# Patient Record
Sex: Male | Born: 1960 | Race: White | Hispanic: No | Marital: Single | Smoking: Former smoker
Health system: Southern US, Community
[De-identification: ages and names within clinical notes are randomized; demographics above are authoritative.]

## PROBLEM LIST (undated history)

## (undated) DIAGNOSIS — G4733 Obstructive sleep apnea (adult) (pediatric): Secondary | ICD-10-CM

## (undated) DIAGNOSIS — J302 Other seasonal allergic rhinitis: Secondary | ICD-10-CM

## (undated) DIAGNOSIS — G459 Transient cerebral ischemic attack, unspecified: Secondary | ICD-10-CM

## (undated) DIAGNOSIS — E785 Hyperlipidemia, unspecified: Secondary | ICD-10-CM

## (undated) DIAGNOSIS — E119 Type 2 diabetes mellitus without complications: Secondary | ICD-10-CM

## (undated) DIAGNOSIS — R251 Tremor, unspecified: Secondary | ICD-10-CM

## (undated) DIAGNOSIS — E669 Obesity, unspecified: Secondary | ICD-10-CM

## (undated) DIAGNOSIS — I10 Essential (primary) hypertension: Secondary | ICD-10-CM

## (undated) HISTORY — DX: Essential (primary) hypertension: I10

## (undated) HISTORY — DX: Type 2 diabetes mellitus without complications: E11.9

## (undated) HISTORY — DX: Hyperlipidemia, unspecified: E78.5

## (undated) HISTORY — DX: Obesity, unspecified: E66.9

## (undated) HISTORY — DX: Transient cerebral ischemic attack, unspecified: G45.9

## (undated) HISTORY — DX: Obstructive sleep apnea (adult) (pediatric): G47.33

## (undated) HISTORY — DX: Tremor, unspecified: R25.1

## (undated) HISTORY — PX: HEMORROIDECTOMY: SUR656

## (undated) HISTORY — DX: Other seasonal allergic rhinitis: J30.2

## (undated) HISTORY — PX: NOSE SURGERY: SHX723

## (undated) HISTORY — PX: CARDIAC CATHETERIZATION: SHX172

---

## 1999-09-06 ENCOUNTER — Ambulatory Visit: Admission: RE | Admit: 1999-09-06 | Discharge: 1999-09-06 | Payer: Self-pay | Admitting: *Deleted

## 2000-02-07 ENCOUNTER — Ambulatory Visit (HOSPITAL_COMMUNITY): Admission: RE | Admit: 2000-02-07 | Discharge: 2000-02-08 | Payer: Self-pay | Admitting: *Deleted

## 2016-04-01 ENCOUNTER — Ambulatory Visit (INDEPENDENT_AMBULATORY_CARE_PROVIDER_SITE_OTHER): Payer: BC Managed Care – PPO | Admitting: Pulmonary Disease

## 2016-04-01 ENCOUNTER — Encounter: Payer: Self-pay | Admitting: Pulmonary Disease

## 2016-04-01 ENCOUNTER — Telehealth: Payer: Self-pay

## 2016-04-01 DIAGNOSIS — G4733 Obstructive sleep apnea (adult) (pediatric): Secondary | ICD-10-CM | POA: Insufficient documentation

## 2016-04-01 HISTORY — DX: Obstructive sleep apnea (adult) (pediatric): G47.33

## 2016-04-01 NOTE — Assessment & Plan Note (Signed)
Patient had a sleep study in 2001, day before 9/11. Had snoring, gasping, choking, witnessed apneas, hypersomnia. Unsure severity. He had nasal surgery to treat OSA but he remained symptomatic. He received a cpap machine in 2003 thereabouts. He felt better. He had issues with payment for machine and supplies so he was switched to a different company in 2016.  His primary care provider ordered a new CPAP machine through [employer]. Unsure what the settings are. He gets supplies every 3 months or so.  Lately, patient has been falling asleep before he can put on the CPAP machine. He also does a lot of reading in bed and he sometimes falls asleep before he is able to put the CPAP on. Because of this, he ends up having sleepiness and hypersomnia during the daytime. If he ends up using the CPAP, he is not as sleepy during the day and he feels well.  No download has been done from his machine. Patient states that DME is called [facility] and they are based in [place].   Plan :  We extensively discussed the importance of treating OSA and the need to use PAP therapy.   Continue with current cpap machine. We tried getting a DL off the machine but could not. Machine does not have an SD card. We instructed the patient to bring the machine to his DME company so they can do a download. Once we have a download, we can determine if he'll need his settings adjusted. The DME company is based  in [place]. We need to determine if there's an office nearer  to [place], [place] hometown. We will try to call [employer].  Pt wants to avoid sleep study 2/2 cost. If need be, he may need a 2 week diagnostic autocpap machine.    Patient was instructed to have mask, tubings, filter, reservoir cleaned at least once a week with soapy water.  Patient was instructed to call the office if he/she is having issues with the PAP device.    I advised patient to obtain sufficient amount of sleep --  7 to 8 hours at least in a 24 hr period.   Patient was advised to follow good sleep hygiene.  Patient was advised NOT to engage in activities requiring concentration and/or vigilance if he/she is and  sleepy.  Patient is NOT to drive if he/she is sleepy.

## 2016-04-01 NOTE — Telephone Encounter (Signed)
LMOM TCB need to discuss with pt. About his cpap machine, and supplies.

## 2016-04-01 NOTE — Progress Notes (Signed)
Subjective:    Patient ID: Frederick Gomez, male    DOB: [birth year], 55 y.o.   MRN: [id number]  HPI   This is the case of Frederick Gomez, 56 y.o. Male, who was referred by Dr. Gwendlyn Deutscher in consultation regarding OSA.   As you very well know, patient is a non smoker, not been diagnosed with asthma or copd.  Patient had a sleep study in 2001, day before 9/11. Had snoring, gasping, choking, witnessed apneas, hypersomnia. Unsure severity. He had nasal surgery to treat OSA but he remained symptomatic. He received a cpap machine in 2003 thereabouts. He felt better. He had issues with payment for machine and supplies so he was switched to a different company in 2016.  His primary care provider ordered a new CPAP machine through [employer]. Unsure what the settings are. He gets supplies every 3 months or so.  Lately, patient has been falling asleep before he can put on the CPAP machine. He also does a lot of reading in bed and he sometimes falls asleep before he is able to put the CPAP on. Because of this, he ends up having sleepiness and hypersomnia during the daytime. If he ends up using the CPAP, he is not as sleepy during the day and he feels well.  No download has been done from his machine. Patient states that DME is called [employer] and they are based in [place].     Review of Systems  Constitutional: Negative.  Negative for fever and unexpected weight change.  HENT: Positive for sneezing. Negative for congestion, dental problem, ear pain, nosebleeds, postnasal drip, rhinorrhea, sinus pressure, sore throat and trouble swallowing.   Eyes: Negative.  Negative for redness and itching.  Respiratory: Negative.  Negative for cough, chest tightness, shortness of breath and wheezing.   Cardiovascular: Negative.  Negative for palpitations and leg swelling.  Gastrointestinal: Negative.  Negative for nausea and vomiting.  Endocrine: Negative.   Genitourinary: Negative.  Negative for dysuria.    Musculoskeletal: Negative.  Negative for joint swelling.  Skin: Negative.  Negative for rash.  Allergic/Immunologic: Positive for environmental allergies.  Neurological: Positive for dizziness. Negative for headaches.  Hematological: Negative.  Does not bruise/bleed easily.  Psychiatric/Behavioral: Negative.  Negative for dysphoric mood. The patient is not nervous/anxious.    Past Medical History:  Diagnosis Date  . Hyperlipidemia   . Hypertension   . Seasonal allergies    (-) CA, DVT  No family history on file.  Mother had weight issues.  Father committed suicide.   No past surgical history on file.  Nasal surgery in 2002.  S/P Hemorrhoids surgery.   Social History   Social History  . Marital status: Single    Spouse name: N/A  . Number of children: N/A  . Years of education: N/A   Occupational History  . Not on file.   Social History Main Topics  . Smoking status: Former Smoker    Types: Cigarettes    Quit date: 06/10/1979  . Smokeless tobacco: Never Used  . Alcohol use Not on file  . Drug use: Unknown  . Sexual activity: Not on file   Other Topics Concern  . Not on file   Social History Narrative  . 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 (-) kids. Lives by himself.  Lives in [place].   No Known Allergies   No outpatient prescriptions prior to visit.   No facility-administered medications prior to visit.  Meds ordered this encounter  Medications  . amLODipine (NORVASC) 5 MG tablet  . clopidogrel (PLAVIX) 75 MG tablet  . losartan (COZAAR) 50 MG tablet  . primidone (MYSOLINE) 50 MG tablet    Sig: One at breakfast and one at lunch.        Objective:   Physical Exam  Vitals:  Vitals:   04/01/16 1153  BP: 130/80  Pulse: 73  SpO2: 95%  Weight: 240 lb (108.9 kg)  Height: 5' 8.5" (1.74 m)    Constitutional/General:  Pleasant, well-nourished, well-developed, not in any distress,  Comfortably seating.  Well  kempt  Body mass index is 35.96 kg/m. Wt Readings from Last 3 Encounters:  04/01/16 240 lb (108.9 kg)    HEENT: Pupils equal and reactive to light and accommodation. Anicteric sclerae. Normal nasal mucosa.   No oral  lesions,  mouth clear,  oropharynx clear, no postnasal drip. (-) Oral thrush. No dental caries.  Airway - Mallampati class III-IV  Neck: No masses. Midline trachea. No JVD, (-) LAD. (-) bruits appreciated.  Respiratory/Chest: Grossly normal chest. (-) deformity. (-) Accessory muscle use.  Symmetric expansion. (-) Tenderness on palpation.  Resonant on percussion.  Diminished BS on both lower lung zones. (-) wheezing, crackles, rhonchi (-) egophony  Cardiovascular: Regular rate and  rhythm, heart sounds normal, no murmur or gallops, no peripheral edema  Gastrointestinal:  Normal bowel sounds. Soft, non-tender. No hepatosplenomegaly.  (-) masses.   Musculoskeletal:  Normal muscle tone. Normal gait.   Extremities: Grossly normal. (-) clubbing, cyanosis.  (-) edema  Skin: (-) rash,lesions seen.   Neurological/Psychiatric : alert, oriented to time, place, person. Normal mood and affect          Assessment & Plan:  OSA (obstructive sleep apnea) Patient had a sleep study in 2001, day before 9/11. Had snoring, gasping, choking, witnessed apneas, hypersomnia. Unsure severity. He had nasal surgery to treat OSA but he remained symptomatic. He received a cpap machine in 2003 thereabouts. He felt better. He had issues with payment for machine and supplies so he was switched to a different company in 2016.  His primary care provider ordered a new CPAP machine through [facility]. Unsure what the settings are. He gets supplies every 3 months or so.  Lately, patient has been falling asleep before he can put on the CPAP machine. He also does a lot of reading in bed and he sometimes falls asleep before he is able to put the CPAP on. Because of this, he ends up having sleepiness  and hypersomnia during the daytime. If he ends up using the CPAP, he is not as sleepy during the day and he feels well.  No download has been done from his machine. Patient states that DME is called [facility] and they are based in [place].   Plan :  We extensively discussed the importance of treating OSA and the need to use PAP therapy.   Continue with current cpap machine. We tried getting a DL off the machine but could not. Machine does not have an SD card. We instructed the patient to bring the machine to his DME company so they can do a download. Once we have a download, we can determine if he'll need his settings adjusted. The DME company is based  in [place]. We need to determine if there's an office nearer  to [place], his hometown. We will try to call [facility].  Pt wants to avoid sleep study 2/2 cost. If need be, he may  need a 2 week diagnostic autocpap machine.    Patient was instructed to have mask, tubings, filter, reservoir cleaned at least once a week with soapy water.  Patient was instructed to call the office if he/she is having issues with the PAP device.    I advised patient to obtain sufficient amount of sleep --  7 to 8 hours at least in a 24 hr period.  Patient was advised to follow good sleep hygiene.  Patient was advised NOT to engage in activities requiring concentration and/or vigilance if he/she is and  sleepy.  Patient is NOT to drive if he/she is sleepy.       Thank you very much for letting me participate in this patient's care. Please do not hesitate to give me a call if you have any questions or concerns regarding the treatment plan.   Patient will follow up with me in 3 mos.     Pollie MeyerJ. Angelo A. de Dios, MD 04/01/2016   12:40 PM Pulmonary and Critical Care Medicine [facility] Pager: [phone] Office: [phone], Fax: [phone]

## 2016-04-01 NOTE — Patient Instructions (Signed)
  It was a pleasure taking care of you today!  Continue using your CPAP machine.  Please bring your machine to [facility]. We will need to get a download of her machine. We will also order supplies.  Please make sure you use your CPAP device everytime you sleep.  We will monitor the usage of your machine per your insurance requirement.  Your insurance company may take the machine from you if you are not using it regularly.   Please clean the mask, tubings, filter, water reservoir with soapy water every week.  Please use distilled water for the water reservoir.   Please call the office or your machine provider (DME company) if you are having issues with the device.   Return to clinic in 3 months

## 2016-04-02 ENCOUNTER — Telehealth: Payer: Self-pay | Admitting: Pulmonary Disease

## 2016-04-02 NOTE — Telephone Encounter (Signed)
Spoke with [name] re: yesterday's call to pt- [name] states that pt needs an SD card for his cpap machine, and that the closest office is [facility] in [place].  Per [name], [facility] can either mail him an SD card or he can pick it up in their office.  lmtcb X1 for patient to relay info.

## 2016-04-03 ENCOUNTER — Telehealth: Payer: Self-pay

## 2016-04-03 NOTE — Telephone Encounter (Signed)
LMOM telling pt. That Dr. Christene Slatese Dios wants him to have a follow up appt. In 3 months. He needs to get that scheduled. There is also another phone message for him.

## 2016-04-04 NOTE — Telephone Encounter (Signed)
Called pt back and left him the number to [facility] in [place] so he can call to get them to mail him an SD card for his cpap

## 2019-02-23 ENCOUNTER — Encounter: Payer: Self-pay | Admitting: *Deleted

## 2019-02-24 ENCOUNTER — Ambulatory Visit (INDEPENDENT_AMBULATORY_CARE_PROVIDER_SITE_OTHER): Payer: BC Managed Care – PPO | Admitting: Cardiology

## 2019-02-24 ENCOUNTER — Encounter: Payer: Self-pay | Admitting: Cardiology

## 2019-02-24 ENCOUNTER — Other Ambulatory Visit: Payer: Self-pay

## 2019-02-24 VITALS — BP 136/94 | HR 70 | Ht 68.5 in | Wt 242.0 lb

## 2019-02-24 DIAGNOSIS — I10 Essential (primary) hypertension: Secondary | ICD-10-CM

## 2019-02-24 DIAGNOSIS — E669 Obesity, unspecified: Secondary | ICD-10-CM

## 2019-02-24 DIAGNOSIS — E782 Mixed hyperlipidemia: Secondary | ICD-10-CM | POA: Diagnosis not present

## 2019-02-24 DIAGNOSIS — G459 Transient cerebral ischemic attack, unspecified: Secondary | ICD-10-CM

## 2019-02-24 DIAGNOSIS — G4733 Obstructive sleep apnea (adult) (pediatric): Secondary | ICD-10-CM | POA: Diagnosis not present

## 2019-02-24 DIAGNOSIS — E119 Type 2 diabetes mellitus without complications: Secondary | ICD-10-CM

## 2019-02-24 MED ORDER — NEBIVOLOL HCL 5 MG PO TABS: 5.0000 mg | ORAL_TABLET | Freq: Every day | ORAL | 1 refills | Status: DC

## 2019-02-24 NOTE — Patient Instructions (Signed)
Medication Instructions:  Your physician has recommended you make the following change in your medication:   Bystolic 5 mg Take 1 tab daily. This was called in to your pharmacy  If you need a refill on your cardiac medications before your next appointment, please call your pharmacy.   Lab work: Your physician recommends that you return for lab work in: 1 month Fasting lipid  If you have labs (blood work) drawn today and your tests are completely normal, you will receive your results only by: Marland Kitchen MyChart Message (if you have MyChart) OR . A paper copy in the mail If you have any lab test that is abnormal or we need to change your treatment, we will call you to review the results.  Testing/Procedures: Your physician has requested that you have an echocardiogram. Echocardiography is a painless test that uses sound waves to create images of your heart. It provides your doctor with information about the size and shape of your heart and how well your heart's chambers and valves are working. This procedure takes approximately one hour. There are no restrictions for this procedure.    Follow-Up: At [facility], you and your health needs are our priority.  As part of our continuing mission to provide you with exceptional heart care, we have created designated Provider Care Teams.  These Care Teams include your primary Cardiologist (physician) and Advanced Practice Providers (APPs -  Physician Assistants and Nurse Practitioners) who all work together to provide you with the care you need, when you need it. You will need a follow up appointment in 1 months with Dr Harriet Masson. Any Other Special Instructions Will Be Listed Below (If Applicable).

## 2019-02-24 NOTE — Progress Notes (Addendum)
Cardiology Office Note:    Date:  02/24/2019   ID:  Frederick SneddonRussell Balistreri, DOB [birth year], MRN [id number]  PCP:  Ninfa MeekerMitchell, Meredith T, FNP  Cardiologist:  No primary care provider on file.  Electrophysiologist:  None   Referring MD: Vertell NovakMitchell, Meredith T, F*   The patient was referred for hypertension. History of Present Illness:    Frederick Gomez is a 58 y.o. male with a hx of hypertension, diabetes, hyperlipidemia, OSA and previous TIAs on Plavix presents to establish cardiovascular care.  He does not have any specific complaints, he denies chest pain, palpitation or shortness of breath.   Past Medical History:  Diagnosis Date  . Diabetes (HCC)   . Hyperlipidemia   . Hypertension   . Obese   . OSA on CPAP   . Seasonal allergies   . TIA (transient ischemic attack)     Past Surgical History:  Procedure Laterality Date  . CARDIAC CATHETERIZATION    . HEMORROIDECTOMY    . NOSE SURGERY      Current Medications: Current Meds  Medication Sig  . amLODipine (NORVASC) 5 MG tablet Take 5 mg by mouth daily.   . clopidogrel (PLAVIX) 75 MG tablet Take 75 mg by mouth daily.   . empagliflozin (JARDIANCE) 10 MG TABS tablet Take 10 mg by mouth daily.  . furosemide (LASIX) 80 MG tablet Take 80 mg by mouth daily.  Marland Kitchen. lovastatin (MEVACOR) 20 MG tablet Take 20 mg by mouth at bedtime.   . metFORMIN (GLUCOPHAGE) 500 MG tablet Take 1,000 mg by mouth at bedtime.   . polycarbophil (FIBERCON) 625 MG tablet Take 625 mg by mouth as needed.   . primidone (MYSOLINE) 250 MG tablet Take 250 mg by mouth daily.      Allergies:   Hydrochlorothiazide, Niaspan [niacin], Pravastatin, Simvastatin, and Latex   Social History   Socioeconomic History  . Marital status: Single    Spouse name: Not on file  . Number of children: Not on file  . Years of education: Not on file  . Highest education level: Not on file  Occupational History  . Not on file  Social Needs  . Financial resource strain: Not on file  . Food  insecurity    Worry: Not on file    Inability: Not on file  . Transportation needs    Medical: Not on file    Non-medical: Not on file  Tobacco Use  . Smoking status: Former Smoker    Years: 3.00    Types: Cigarettes    Quit date: 06/10/1979    Years since quitting: 39.7  . Smokeless tobacco: Never Used  Substance and Sexual Activity  . Alcohol use: Never    Frequency: Never  . Drug use: Never  . Sexual activity: Not on file  Lifestyle  . Physical activity    Days per week: Not on file    Minutes per session: Not on file  . Stress: Not on file  Relationships  . Social Musicianconnections    Talks on phone: Not on file    Gets together: Not on file    Attends religious service: Not on file    Active member of club or organization: Not on file    Attends meetings of clubs or organizations: Not on file    Relationship status: Not on file  Other Topics Concern  . Not on file  Social History Narrative  . Not on file     Family History: The patient's family history  includes Diabetes in his mother; Heart attack in his mother.  ROS:   .  Review of Systems  Constitution: Negative for decreased appetite, fever and weight gain.  HENT: Negative for congestion, ear discharge, hoarse voice and sore throat.   Eyes: Negative for discharge, redness, vision loss in right eye and visual halos.  Cardiovascular: Negative for chest pain, dyspnea on exertion, leg swelling, orthopnea and palpitations.  Respiratory: Negative for cough, hemoptysis, shortness of breath and snoring.   Endocrine: Negative for heat intolerance and polyphagia.  Hematologic/Lymphatic: Negative for bleeding problem. Does not bruise/bleed easily.  Skin: Negative for flushing, nail changes, rash and suspicious lesions.  Musculoskeletal: Negative for arthritis, joint pain, muscle cramps, myalgias, neck pain and stiffness.  Gastrointestinal: Negative for abdominal pain, bowel incontinence, diarrhea and excessive appetite.   Genitourinary: Negative for decreased libido, genital sores and incomplete emptying.  Neurological: Negative for brief paralysis, focal weakness, headaches and loss of balance.  Psychiatric/Behavioral: Negative for altered mental status, depression and suicidal ideas.  Allergic/Immunologic: Negative for HIV exposure and persistent infections.      EKGs/Labs/Other Studies Reviewed:    The following studies were reviewed today:   EKG: The ekg ordered today demonstrates sinus rhythm, heart rate 70 bpm, nonspecific ST changes.  No previous EKG to compare.  Recent Labs: No results found for requested labs within last 8760 hours.  Recent Lipid Panel No results found for: CHOL, TRIG, HDL, CHOLHDL, VLDL, LDLCALC, LDLDIRECT  Physical Exam:    VS:  BP (!) 136/94 (BP Location: Right Arm, Patient Position: Sitting, Cuff Size: Normal)   Pulse 70   Ht 5' 8.5" (1.74 m)   Wt 242 lb (109.8 kg)   SpO2 98%   BMI 36.26 kg/m     Wt Readings from Last 3 Encounters:  02/24/19 242 lb (109.8 kg)  04/01/16 240 lb (108.9 kg)     GEN: Well nourished, well developed in no acute distress HEENT: Mucous membranes moist, good dentition NECK: No JVD; No carotid bruits LYMPHATICS: No lymphadenopathy CARDIAC: S1S2 noted, RRR, II/VI holosystolic murmurs, rubs, gallops RESPIRATORY:  Clear to auscultation without rales, wheezing or rhonchi  ABDOMEN: Soft, non-tender, significant truncal obesity, bowel sounds noted, no guarding EXTREMITIES: Bilateral trace ankle, no cyanosis, no clubbing MUSCULOSKELETAL: No deformity   SKIN: Warm and dry NEUROLOGIC:  Alert and oriented x 3, nonfocal PSYCHIATRIC:  Normal affect, good insight  ASSESSMENT:    1. Hypertension, unspecified type   2. OSA (obstructive sleep apnea)   3. Mixed hyperlipidemia   4. TIA (transient ischemic attack)   5. Obese body habitus   6. Type 2 diabetes mellitus without complication, without long-term current use of insulin (HCC)    PLAN:     His blood pressure is not well controlled in the office today.  He is currently on amlodipine 5 mg daily, losartan 50 mg daily, Bystolic 5 mg.  He reports that he ran out of the [place].  Going to send refills to his pharmacy which he is going to pick up.  Advised patient on decreasing his salt intake as well as increasing exercise and fruit and vegetables in his diet. His physical exam revealed a holosystolic murmur for which I will get a transthoracic echocardiogram to assess. He is on lovastatin, lipid panel will be performed prior to his next office visit. He does have untreated obstructive sleep apnea, have encouraged him to follow with a pulmonologist or sleep doctor in hopes that he will be able to be refitted  for CPAP. The patient is in agreement with the above plan.  He left the office in stable condition.  He will follow-up in 1 month.  Medication Adjustments/Labs and Tests Ordered: Current medicines are reviewed at length with the patient today.  Concerns regarding medicines are outlined above.  Orders Placed This Encounter  Procedures  . Lipid Profile  . EKG 12-Lead  . ECHOCARDIOGRAM COMPLETE   Meds ordered this encounter  Medications  . nebivolol (BYSTOLIC) 5 MG tablet    Sig: Take 1 tablet (5 mg total) by mouth daily.    Dispense:  90 tablet    Refill:  1    Patient Instructions  Medication Instructions:  Your physician has recommended you make the following change in your medication:   Bystolic 5 mg Take 1 tab daily. This was called in to your pharmacy  If you need a refill on your cardiac medications before your next appointment, please call your pharmacy.   Lab work: Your physician recommends that you return for lab work in: 1 month Fasting lipid  If you have labs (blood work) drawn today and your tests are completely normal, you will receive your results only by: Marland Kitchen MyChart Message (if you have MyChart) OR . A paper copy in the mail If you have any lab test  that is abnormal or we need to change your treatment, we will call you to review the results.  Testing/Procedures: Your physician has requested that you have an echocardiogram. Echocardiography is a painless test that uses sound waves to create images of your heart. It provides your doctor with information about the size and shape of your heart and how well your heart's chambers and valves are working. This procedure takes approximately one hour. There are no restrictions for this procedure.    Follow-Up: At [facility], you and your health needs are our priority.  As part of our continuing mission to provide you with exceptional heart care, we have created designated Provider Care Teams.  These Care Teams include your primary Cardiologist (physician) and Advanced Practice Providers (APPs -  Physician Assistants and Nurse Practitioners) who all work together to provide you with the care you need, when you need it. You will need a follow up appointment in 1 months with Dr Servando Salina. Any Other Special Instructions Will Be Listed Below (If Applicable).       Osvaldo Shipper, DO  02/24/2019 10:47 PM    [facility]

## 2019-03-28 NOTE — Addendum Note (Signed)
Addended by: Particia Nearing B on: 03/28/2019 09:55 AM   Modules accepted: Orders

## 2019-03-29 ENCOUNTER — Ambulatory Visit (INDEPENDENT_AMBULATORY_CARE_PROVIDER_SITE_OTHER): Payer: BC Managed Care – PPO

## 2019-03-29 ENCOUNTER — Other Ambulatory Visit: Payer: Self-pay

## 2019-03-29 DIAGNOSIS — I10 Essential (primary) hypertension: Secondary | ICD-10-CM | POA: Diagnosis not present

## 2019-03-29 NOTE — Progress Notes (Signed)
Complete echocardiogram has been performed.  Jimmy Mcarthur Ivins RDCS, RVT 

## 2019-03-30 ENCOUNTER — Ambulatory Visit (INDEPENDENT_AMBULATORY_CARE_PROVIDER_SITE_OTHER): Payer: BC Managed Care – PPO | Admitting: Cardiology

## 2019-03-30 ENCOUNTER — Encounter: Payer: Self-pay | Admitting: Cardiology

## 2019-03-30 VITALS — BP 150/100 | HR 76 | Ht 68.5 in | Wt 249.0 lb

## 2019-03-30 DIAGNOSIS — E782 Mixed hyperlipidemia: Secondary | ICD-10-CM

## 2019-03-30 DIAGNOSIS — E669 Obesity, unspecified: Secondary | ICD-10-CM

## 2019-03-30 DIAGNOSIS — I350 Nonrheumatic aortic (valve) stenosis: Secondary | ICD-10-CM

## 2019-03-30 DIAGNOSIS — G4733 Obstructive sleep apnea (adult) (pediatric): Secondary | ICD-10-CM

## 2019-03-30 DIAGNOSIS — I712 Thoracic aortic aneurysm, without rupture: Secondary | ICD-10-CM

## 2019-03-30 DIAGNOSIS — G459 Transient cerebral ischemic attack, unspecified: Secondary | ICD-10-CM

## 2019-03-30 DIAGNOSIS — I351 Nonrheumatic aortic (valve) insufficiency: Secondary | ICD-10-CM

## 2019-03-30 DIAGNOSIS — I10 Essential (primary) hypertension: Secondary | ICD-10-CM

## 2019-03-30 DIAGNOSIS — I7121 Aneurysm of the ascending aorta, without rupture: Secondary | ICD-10-CM

## 2019-03-30 DIAGNOSIS — E119 Type 2 diabetes mellitus without complications: Secondary | ICD-10-CM

## 2019-03-30 HISTORY — DX: Essential (primary) hypertension: I10

## 2019-03-30 HISTORY — DX: Type 2 diabetes mellitus without complications: E11.9

## 2019-03-30 HISTORY — DX: Mixed hyperlipidemia: E78.2

## 2019-03-30 HISTORY — DX: Nonrheumatic aortic (valve) stenosis: I35.0

## 2019-03-30 HISTORY — DX: Thoracic aortic aneurysm, without rupture: I71.2

## 2019-03-30 HISTORY — DX: Nonrheumatic aortic (valve) insufficiency: I35.1

## 2019-03-30 HISTORY — DX: Obesity, unspecified: E66.9

## 2019-03-30 HISTORY — DX: Aneurysm of the ascending aorta, without rupture: I71.21

## 2019-03-30 LAB — LIPID PANEL
Chol/HDL Ratio: 4.7 ratio (ref 0.0–5.0)
Cholesterol, Total: 175 mg/dL (ref 100–199)
HDL: 37 mg/dL — ABNORMAL LOW (ref >39)
LDL Chol Calc (NIH): 118 mg/dL — ABNORMAL HIGH (ref 0–99)
Triglycerides: 108 mg/dL (ref 0–149)
VLDL Cholesterol Cal: 20 mg/dL (ref 5–40)

## 2019-03-30 LAB — BASIC METABOLIC PANEL
BUN/Creatinine Ratio: 12 (ref 9–20)
BUN: 11 mg/dL (ref 6–24)
CO2: 29 mmol/L (ref 20–29)
Calcium: 9 mg/dL (ref 8.7–10.2)
Chloride: 100 mmol/L (ref 96–106)
Creatinine, Ser: 0.9 mg/dL (ref 0.76–1.27)
GFR calc Af Amer: 110 mL/min/{1.73_m2} (ref >59)
GFR calc non Af Amer: 95 mL/min/{1.73_m2} (ref >59)
Glucose: 142 mg/dL — ABNORMAL HIGH (ref 65–99)
Potassium: 4.2 mmol/L (ref 3.5–5.2)
Sodium: 140 mmol/L (ref 134–144)

## 2019-03-30 NOTE — Patient Instructions (Signed)
Medication Instructions:  Your physician recommends that you continue on your current medications as directed. Please refer to the Current Medication list given to you today.  *If you need a refill on your cardiac medications before your next appointment, please call your pharmacy*  Lab Work: None If you have labs (blood work) drawn today and your tests are completely normal, you will receive your results only by: [place] Kitchen MyChart Message (if you have MyChart) OR . A paper copy in the mail If you have any lab test that is abnormal or we need to change your treatment, we will call you to review the results.  Testing/Procedures: None  Follow-Up: At [facility], you and your health needs are our priority.  As part of our continuing mission to provide you with exceptional heart care, we have created designated Provider Care Teams.  These Care Teams include your primary Cardiologist (physician) and Advanced Practice Providers (APPs -  Physician Assistants and Nurse Practitioners) who all work together to provide you with the care you need, when you need it.  Your next appointment:   1 month  The format for your next appointment:   In Person  Provider:   Berniece Salines, DO  Other Instructions   Blood Pressure Record Sheet To take your blood pressure, you will need a blood pressure machine. You can buy a blood pressure machine (blood pressure monitor) at your clinic, drug store, or online. When choosing one, consider:  An automatic monitor that has an arm cuff.  A cuff that wraps snugly around your upper arm. You should be able to fit only one finger between your arm and the cuff.  A device that stores blood pressure reading results.  Do not choose a monitor that measures your blood pressure from your wrist or finger. Follow your health care provider's instructions for how to take your blood pressure. To use this form:  Get one reading in the morning (a.m.) before you take any medicines.   Get one reading in the evening (p.m.) before supper.  Take at least 2 readings with each blood pressure check. This makes sure the results are correct. Wait 1-2 minutes between measurements.  Write down the results in the spaces on this form.  Repeat this once a week, or as told by your health care provider.  Make a follow-up appointment with your health care provider to discuss the results. Blood pressure log Date: _______________________  a.m. _____________________(1st reading) _____________________(2nd reading)  p.m. _____________________(1st reading) _____________________(2nd reading) Date: _______________________  a.m. _____________________(1st reading) _____________________(2nd reading)  p.m. _____________________(1st reading) _____________________(2nd reading) Date: _______________________  a.m. _____________________(1st reading) _____________________(2nd reading)  p.m. _____________________(1st reading) _____________________(2nd reading) Date: _______________________  a.m. _____________________(1st reading) _____________________(2nd reading)  p.m. _____________________(1st reading) _____________________(2nd reading) Date: _______________________  a.m. _____________________(1st reading) _____________________(2nd reading)  p.m. _____________________(1st reading) _____________________(2nd reading) This information is not intended to replace advice given to you by your health care provider. Make sure you discuss any questions you have with your health care provider. Document Released: 02/22/2003 Document Revised: 07/24/2017 Document Reviewed: 05/26/2017 Elsevier Patient Education  2020 [employer].

## 2019-03-30 NOTE — Progress Notes (Signed)
Cardiology Office Note:    Date:  03/30/2019   ID:  Frederick Gomez, DOB [birth year], MRN [id number]  PCP:  Ninfa MeekerMitchell, Meredith T, FNP  Cardiologist:  Thomasene RippleKardie Lacara Dunsworth, DO  Electrophysiologist:  None   Referring MD: Vertell NovakMitchell, Meredith T, F*   Chief Complaint  Patient presents with   Follow-up    echo    History of Present Illness:    Frederick SneddonRussell Gomez is a 58 y.o. male with a hx of hx of hypertension, diabetes, hyperlipidemia, OSA and previous TIAs on Plavix, OSA on CPAP presents for follow-up visit.  The patient was initially seen on February 24, 2019 at which time he was hypertensive.  During our visit patient reported he ran out of his Bystolic.  And we did send that medication to the pharmacy for him.  So with that he was supposed to be on amlodipine 5 mg daily, losartan 50 mg daily, and Bystolic 5 mg daily.  In addition an echocardiogram was performed due to physical exam appreciating holosystolic murmur.  The patient is here for follow-up visit today.  He offers no complaints.  Past Medical History:  Diagnosis Date   Diabetes (HCC)    Hyperlipidemia    Hypertension    Obese    OSA on CPAP    Seasonal allergies    TIA (transient ischemic attack)     Past Surgical History:  Procedure Laterality Date   CARDIAC CATHETERIZATION     HEMORROIDECTOMY     NOSE SURGERY      Current Medications: Current Meds  Medication Sig   amLODipine (NORVASC) 5 MG tablet Take 5 mg by mouth daily.    clopidogrel (PLAVIX) 75 MG tablet Take 75 mg by mouth daily.    empagliflozin (JARDIANCE) 10 MG TABS tablet Take 10 mg by mouth daily.   furosemide (LASIX) 80 MG tablet Take 80 mg by mouth daily.   lovastatin (MEVACOR) 20 MG tablet Take 20 mg by mouth at bedtime.    metFORMIN (GLUCOPHAGE) 500 MG tablet Take 1,000 mg by mouth at bedtime.    nebivolol (BYSTOLIC) 5 MG tablet Take 1 tablet (5 mg total) by mouth daily.   polycarbophil (FIBERCON) 625 MG tablet Take 625 mg by mouth as  needed.    primidone (MYSOLINE) 250 MG tablet Take 250 mg by mouth daily.      Allergies:   Hydrochlorothiazide, Niaspan [niacin], Pravastatin, Simvastatin, and Latex   Social History   Socioeconomic History   Marital status: Single    Spouse name: Not on file   Number of children: Not on file   Years of education: Not on file   Highest education level: Not on file  Occupational History   Not on file  Social Needs   Financial resource strain: Not on file   Food insecurity    Worry: Not on file    Inability: Not on file   Transportation needs    Medical: Not on file    Non-medical: Not on file  Tobacco Use   Smoking status: Former Smoker    Years: 3.00    Types: Cigarettes    Quit date: 06/10/1979    Years since quitting: 39.8   Smokeless tobacco: Never Used  Substance and Sexual Activity   Alcohol use: Never    Frequency: Never   Drug use: Never   Sexual activity: Not on file  Lifestyle   Physical activity    Days per week: Not on file    Minutes per session: Not  on file   Stress: Not on file  Relationships   Social connections    Talks on phone: Not on file    Gets together: Not on file    Attends religious service: Not on file    Active member of club or organization: Not on file    Attends meetings of clubs or organizations: Not on file    Relationship status: Not on file  Other Topics Concern   Not on file  Social History Narrative   Not on file     Family History: The patient's family history includes Diabetes in his mother; Heart attack in his mother.  ROS:   Review of Systems  Constitution: Negative for decreased appetite, fever and weight gain.  HENT: Negative for congestion, ear discharge, hoarse voice and sore throat.   Eyes: Negative for discharge, redness, vision loss in right eye and visual halos.  Cardiovascular: Negative for chest pain, dyspnea on exertion, leg swelling, orthopnea and palpitations.  Respiratory: Negative  for cough, hemoptysis, shortness of breath and snoring.   Endocrine: Negative for heat intolerance and polyphagia.  Hematologic/Lymphatic: Negative for bleeding problem. Does not bruise/bleed easily.  Skin: Negative for flushing, nail changes, rash and suspicious lesions.  Musculoskeletal: Negative for arthritis, joint pain, muscle cramps, myalgias, neck pain and stiffness.  Gastrointestinal: Negative for abdominal pain, bowel incontinence, diarrhea and excessive appetite.  Genitourinary: Negative for decreased libido, genital sores and incomplete emptying.  Neurological: Negative for brief paralysis, focal weakness, headaches and loss of balance.  Psychiatric/Behavioral: Negative for altered mental status, depression and suicidal ideas.  Allergic/Immunologic: Negative for HIV exposure and persistent infections.    EKGs/Labs/Other Studies Reviewed:    The following studies were reviewed today:   EKG: None today Recent Labs: 03/29/2019: BUN 11; Creatinine, Ser 0.90; Potassium 4.2; Sodium 140  Recent Lipid Panel    Component Value Date/Time   CHOL 175 03/29/2019 0858   TRIG 108 03/29/2019 0858   HDL 37 (L) 03/29/2019 0858   CHOLHDL 4.7 03/29/2019 0858   LDLCALC 118 (H) 03/29/2019 0858    Physical Exam:    VS:  BP (!) 150/100 (BP Location: Left Arm, Patient Position: Sitting, Cuff Size: Normal)    Pulse 76    Ht 5' 8.5" (1.74 m)    Wt 249 lb (112.9 kg)    SpO2 98%    BMI 37.31 kg/m     Wt Readings from Last 3 Encounters:  03/30/19 249 lb (112.9 kg)  02/24/19 242 lb (109.8 kg)  04/01/16 240 lb (108.9 kg)     GEN: obese, Well nourished, well developed in no acute distress HEENT: Normal NECK: No JVD; No carotid bruits LYMPHATICS: No lymphadenopathy CARDIAC: S1S2 noted,RRR, no murmurs, rubs, gallops RESPIRATORY:  Clear to auscultation without rales, wheezing or rhonchi  ABDOMEN: Soft, non-tender, non-distended, +bowel sounds, no guarding. EXTREMITIES: No edema, No cyanosis,  no clubbing MUSCULOSKELETAL:  No edema; No deformity  SKIN: Warm and dry NEUROLOGIC:  Alert and oriented x 3, non-focal PSYCHIATRIC:  Normal affect, good insight  ASSESSMENT:    1. Ascending aortic aneurysm (HCC)   2. Essential hypertension   3. Mild aortic stenosis   4. Mild aortic regurgitation   5. TIA (transient ischemic attack)   6. Type 2 diabetes mellitus without complication, without long-term current use of insulin (HCC)   7. Mixed hyperlipidemia   8. OSA (obstructive sleep apnea)   9. Obesity (BMI 30-39.9)    PLAN:    In order of problems  listed above:1.  I did discuss his echocardiogram results with him today explained to patient that he does have a dilated ascending aorta (4.1 cm).  There is also evidence of mild aortic stenosis and mild regurgitation.  We will get a CTA of the chest in 6 months to assess for any worsening progression of this aneurysm.    2.  He is hypertensive today in the office manually 150/100 mmHg, he did not take his medication prior to his visit.  He tells me that his blood pressure has been mostly running in the 409W to 119J systolic.  I have advised the patient that he needs to take his blood pressure daily regardless of any doctor visit.  I also stressed with him that controlling his blood pressure will really help [place] in slowing down the progression of his aortic aneurysm.  I have also advised the patient to decrease his salt intake.  3.  The patient understands the need to lose weight with diet and exercise. We have discussed specific strategies for this.  4.  Continue current statin, and Plavix.  The patient is in agreement with the above plan. The patient left the office in stable condition.  The patient will follow up in 1 month.   Medication Adjustments/Labs and Tests Ordered: Current medicines are reviewed at length with the patient today.  Concerns regarding medicines are outlined above.  No orders of the defined types were placed in this  encounter.  No orders of the defined types were placed in this encounter.   Patient Instructions  Medication Instructions:  Your physician recommends that you continue on your current medications as directed. Please refer to the Current Medication list given to you today.  *If you need a refill on your cardiac medications before your next appointment, please call your pharmacy*  Lab Work: None If you have labs (blood work) drawn today and your tests are completely normal, you will receive your results only by:  [place] (if you have MyChart) OR  A paper copy in the mail If you have any lab test that is abnormal or we need to change your treatment, we will call you to review the results.  Testing/Procedures: None  Follow-Up: At [facility], you and your health needs are our priority.  As part of our continuing mission to provide you with exceptional heart care, we have created designated Provider Care Teams.  These Care Teams include your primary Cardiologist (physician) and Advanced Practice Providers (APPs -  Physician Assistants and Nurse Practitioners) who all work together to provide you with the care you need, when you need it.  Your next appointment:   1 month  The format for your next appointment:   In Person  Provider:   Berniece Salines, DO  Other Instructions   Blood Pressure Record Sheet To take your blood pressure, you will need a blood pressure machine. You can buy a blood pressure machine (blood pressure monitor) at your clinic, drug store, or online. When choosing one, consider:  An automatic monitor that has an arm cuff.  A cuff that wraps snugly around your upper arm. You should be able to fit only one finger between your arm and the cuff.  A device that stores blood pressure reading results.  Do not choose a monitor that measures your blood pressure from your wrist or finger. Follow your health care provider's instructions for how to take your  blood pressure. To use this form:  Get one reading in  the morning (a.m.) before you take any medicines.  Get one reading in the evening (p.m.) before supper.  Take at least 2 readings with each blood pressure check. This makes sure the results are correct. Wait 1-2 minutes between measurements.  Write down the results in the spaces on this form.  Repeat this once a week, or as told by your health care provider.  Make a follow-up appointment with your health care provider to discuss the results. Blood pressure log Date: _______________________  a.m. _____________________(1st reading) _____________________(2nd reading)  p.m. _____________________(1st reading) _____________________(2nd reading) Date: _______________________  a.m. _____________________(1st reading) _____________________(2nd reading)  p.m. _____________________(1st reading) _____________________(2nd reading) Date: _______________________  a.m. _____________________(1st reading) _____________________(2nd reading)  p.m. _____________________(1st reading) _____________________(2nd reading) Date: _______________________  a.m. _____________________(1st reading) _____________________(2nd reading)  p.m. _____________________(1st reading) _____________________(2nd reading) Date: _______________________  a.m. _____________________(1st reading) _____________________(2nd reading)  p.m. _____________________(1st reading) _____________________(2nd reading) This information is not intended to replace advice given to you by your health care provider. Make sure you discuss any questions you have with your health care provider. Document Released: 02/22/2003 Document Revised: 07/24/2017 Document Reviewed: 05/26/2017 Elsevier Patient Education  2020 [employer]     [employer]  Tips to measure your blood pressure correctly  To determine whether you have hypertension, a medical professional will take a blood pressure  reading. How you prepare for the test, the position of your arm, and other factors can change a blood pressure reading by 10% or more. That could be enough to hide high blood pressure, start you on a drug you don't really need, or lead your doctor to incorrectly adjust your medications. National and international guidelines offer specific instructions for measuring blood pressure. If a doctor, nurse, or medical assistant isn't doing it right, don't hesitate to ask him or her to get with the guidelines. Here's what you can do to ensure a correct reading:  Don't drink a caffeinated beverage or smoke during the 30 minutes before the test.  Sit quietly for five minutes before the test begins.  During the measurement, sit in a chair with your feet on the floor and your arm supported so your elbow is at about heart level.  The inflatable part of the cuff should completely cover at least 80% of your upper arm, and the cuff should be placed on bare skin, not over a shirt.  Don't talk during the measurement.  Have your blood pressure measured twice, with a brief break in between. If the readings are different by 5 points or more, have it done a third time. There are times to break these rules. If you sometimes feel lightheaded when getting out of bed in the morning or when you stand after sitting, you should have your blood pressure checked while seated and then while standing to see if it falls from one position to the next. Because blood pressure varies throughout the day, your doctor will rarely diagnose hypertension on the basis of a single reading. Instead, he or she will want to confirm the measurements on at least two occasions, usually within a few weeks of one another. The exception to this rule is if you have a blood pressure reading of 180/110 mm Hg or higher. A result this high usually calls for prompt treatment. It's also a good idea to have your blood pressure measured in both arms at least  once, since the reading in one arm (usually the right) may be higher than that in the left. A 2014 study in The  American Journal of Medicine of nearly 3,400 people found average arm- to-arm differences in systolic blood pressure of about 5 points. The higher number should be used to make treatment decisions. In 2017, new guidelines from the American Heart Association, the [employer] of Cardiology, and nine other health organizations lowered the diagnosis of high blood pressure to 130/80 mm Hg or higher for all adults. The guidelines also redefined the various blood pressure categories to now include normal, elevated, Stage 1 hypertension, Stage 2 hypertension, and hypertensive crisis (see "Blood pressure categories"). Blood pressure categories  Blood pressure category SYSTOLIC (upper number)  DIASTOLIC (lower number)  Normal Less than 120 mm Hg and Less than 80 mm Hg  Elevated 120-129 mm Hg and Less than 80 mm Hg  High blood pressure: Stage 1 hypertension 130-139 mm Hg or 80-89 mm Hg  High blood pressure: Stage 2 hypertension 140 mm Hg or higher or 90 mm Hg or higher  Hypertensive crisis (consult your doctor immediately) Higher than 180 mm Hg and/or Higher than 120 mm Hg  Source: American Heart Association and American Stroke Association. For more on getting your blood pressure under control, buy Controlling Your Blood Pressure, a Special Health Report from [facility]. Adopting a Healthy Lifestyle.  Know what a healthy weight is for you (roughly BMI <25) and aim to maintain this   Aim for 7+ servings of fruits and vegetables daily   65-80+ fluid ounces of water or unsweet tea for healthy kidneys   Limit to max 1 drink of alcohol per day; avoid smoking/tobacco   Limit animal fats in diet for cholesterol and heart health - choose grass fed whenever available   Avoid highly processed foods, and foods high in saturated/trans fats   Aim for low stress - take time to unwind and  care for your mental health   Aim for 150 min of moderate intensity exercise weekly for heart health, and weights twice weekly for bone health   Aim for 7-9 hours of sleep daily   When it comes to diets, agreement about the perfect plan isnt easy to find, even among the experts. Experts at the [facility] [employer] developed an idea known as the Healthy Eating Plate. Just imagine a plate divided into logical, healthy portions.   The emphasis is on diet quality:   Load up on vegetables and fruits - one-half of your plate: Aim for color and variety, and remember that potatoes dont count.   Go for whole grains - one-quarter of your plate: Whole wheat, barley, wheat berries, quinoa, oats, brown rice, and foods made with them. If you want pasta, go with whole wheat pasta.   Protein power - one-quarter of your plate: Fish, chicken, beans, and nuts are all healthy, versatile protein sources. Limit red meat.   The diet, however, does go beyond the plate, offering a few other suggestions.   Use healthy plant oils, such as olive, canola, soy, corn, sunflower and peanut. Check the labels, and avoid partially hydrogenated oil, which have unhealthy trans fats.   If youre thirsty, drink water. Coffee and tea are good in moderation, but skip sugary drinks and limit milk and dairy products to one or two daily servings.   The type of carbohydrate in the diet is more important than the amount. Some sources of carbohydrates, such as vegetables, fruits, whole grains, and beans-are healthier than others.   Finally, stay active  Signed, Thomasene Ripple, DO  03/30/2019 10:37 AM  [facility]

## 2019-05-02 ENCOUNTER — Other Ambulatory Visit: Payer: Self-pay

## 2019-05-02 ENCOUNTER — Encounter: Payer: Self-pay | Admitting: Cardiology

## 2019-05-02 ENCOUNTER — Ambulatory Visit (INDEPENDENT_AMBULATORY_CARE_PROVIDER_SITE_OTHER): Payer: BC Managed Care – PPO | Admitting: Cardiology

## 2019-05-02 VITALS — BP 160/96 | HR 84 | Ht 68.5 in | Wt 248.0 lb

## 2019-05-02 DIAGNOSIS — I712 Thoracic aortic aneurysm, without rupture: Secondary | ICD-10-CM

## 2019-05-02 DIAGNOSIS — I350 Nonrheumatic aortic (valve) stenosis: Secondary | ICD-10-CM | POA: Diagnosis not present

## 2019-05-02 DIAGNOSIS — E119 Type 2 diabetes mellitus without complications: Secondary | ICD-10-CM | POA: Diagnosis not present

## 2019-05-02 DIAGNOSIS — E782 Mixed hyperlipidemia: Secondary | ICD-10-CM

## 2019-05-02 DIAGNOSIS — E669 Obesity, unspecified: Secondary | ICD-10-CM

## 2019-05-02 DIAGNOSIS — I7121 Aneurysm of the ascending aorta, without rupture: Secondary | ICD-10-CM

## 2019-05-02 DIAGNOSIS — I10 Essential (primary) hypertension: Secondary | ICD-10-CM

## 2019-05-02 MED ORDER — NEBIVOLOL HCL 20 MG PO TABS: 20.0000 mg | ORAL_TABLET | Freq: Every day | ORAL | 5 refills | Status: DC

## 2019-05-02 NOTE — Patient Instructions (Signed)
Medication Instructions:  Your physician has recommended you make the following change in your medication: INCREASE BYSTOLIC TO 20 MG DAILY  *If you need a refill on your cardiac medications before your next appointment, please call your pharmacy*  Lab Work: NONE If you have labs (blood work) drawn today and your tests are completely normal, you will receive your results only by: Marland Kitchen MyChart Message (if you have MyChart) OR . A paper copy in the mail If you have any lab test that is abnormal or we need to change your treatment, we will call you to review the results.  Testing/Procedures: NONE  Follow-Up: At [facility], you and your health needs are our priority.  As part of our continuing mission to provide you with exceptional heart care, we have created designated Provider Care Teams.  These Care Teams include your primary Cardiologist (physician) and Advanced Practice Providers (APPs -  Physician Assistants and Nurse Practitioners) who all work together to provide you with the care you need, when you need it.  Your next appointment:   1 month(s)  The format for your next appointment:   In Person  Provider:   Berniece Salines, DO  Other Instructions

## 2019-05-02 NOTE — Progress Notes (Signed)
Cardiology Office Note:    Date:  05/02/2019   ID:  Frederick Gomez, DOB 10/12/1960, MRN [id number]  PCP:  Ninfa Meeker, FNP  Cardiologist:  Thomasene Ripple, DO  Electrophysiologist:  None   Referring MD: Vertell Novak*   Chief Complaint  Patient presents with  . Follow-up    History of Present Illness:    Frederick Gomez is a 58 y.o. male with a hx of hx of hypertension, diabetes, hyperlipidemia, OSA and previous TIAs on Plavix, OSA on CPAP presents for follow-up visit.  The patient was initially seen on February 24, 2019 at which time he was hypertensive.  During our visit patient reported he ran out of his Bystolic.  And we did send that medication to the pharmacy for him.  So with that he was supposed to be on amlodipine 5 mg daily, losartan 50 mg daily, and Bystolic 5 mg daily.  He did come for follow-up visit on March 30, 2019 at that time his blood pressure was again elevated but the patient preferred not to start any new medication as he did not take his blood pressure meds prior to his visit.  In addition he is adamant that his blood pressure was running 120s to 130 systolic at home.  He is here for follow-up today.    Past Medical History:  Diagnosis Date  . Diabetes (HCC)   . Hyperlipidemia   . Hypertension   . Obese   . OSA on CPAP   . Seasonal allergies   . TIA (transient ischemic attack)     Past Surgical History:  Procedure Laterality Date  . CARDIAC CATHETERIZATION    . HEMORROIDECTOMY    . NOSE SURGERY      Current Medications: Current Meds  Medication Sig  . amLODipine (NORVASC) 5 MG tablet Take 5 mg by mouth daily.   . clopidogrel (PLAVIX) 75 MG tablet Take 75 mg by mouth daily.   . empagliflozin (JARDIANCE) 10 MG TABS tablet Take 10 mg by mouth daily.  . furosemide (LASIX) 80 MG tablet Take 80 mg by mouth daily.  Marland Kitchen lovastatin (MEVACOR) 20 MG tablet Take 20 mg by mouth at bedtime.   . metFORMIN (GLUCOPHAGE) 500 MG tablet Take 1,000 mg  by mouth at bedtime.   . nebivolol (BYSTOLIC) 5 MG tablet Take 1 tablet (5 mg total) by mouth daily.  . polycarbophil (FIBERCON) 625 MG tablet Take 625 mg by mouth as needed.   . primidone (MYSOLINE) 250 MG tablet Take 250 mg by mouth daily.      Allergies:   Hydrochlorothiazide, Niaspan [niacin], Pravastatin, Simvastatin, and Latex   Social History   Socioeconomic History  . Marital status: Single    Spouse name: Not on file  . Number of children: Not on file  . Years of education: Not on file  . Highest education level: Not on file  Occupational History  . Not on file  Social Needs  . Financial resource strain: Not on file  . Food insecurity    Worry: Not on file    Inability: Not on file  . Transportation needs    Medical: Not on file    Non-medical: Not on file  Tobacco Use  . Smoking status: Former Smoker    Years: 3.00    Types: Cigarettes    Quit date: 06/10/1979    Years since quitting: 39.9  . Smokeless tobacco: Never Used  Substance and Sexual Activity  . Alcohol use: Never  Frequency: Never  . Drug use: Never  . Sexual activity: Not on file  Lifestyle  . Physical activity    Days per week: Not on file    Minutes per session: Not on file  . Stress: Not on file  Relationships  . Social Herbalist on phone: Not on file    Gets together: Not on file    Attends religious service: Not on file    Active member of club or organization: Not on file    Attends meetings of clubs or organizations: Not on file    Relationship status: Not on file  Other Topics Concern  . Not on file  Social History Narrative  . Not on file     Family History: The patient's family history includes Diabetes in his mother; Heart attack in his mother.  ROS:   Review of Systems  Constitution: Negative for decreased appetite, fever and weight gain.  HENT: Negative for congestion, ear discharge, hoarse voice and sore throat.   Eyes: Negative for discharge, redness,  vision loss in right eye and visual halos.  Cardiovascular: Negative for chest pain, dyspnea on exertion, leg swelling, orthopnea and palpitations.  Respiratory: Negative for cough, hemoptysis, shortness of breath and snoring.   Endocrine: Negative for heat intolerance and polyphagia.  Hematologic/Lymphatic: Negative for bleeding problem. Does not bruise/bleed easily.  Skin: Negative for flushing, nail changes, rash and suspicious lesions.  Musculoskeletal: Negative for arthritis, joint pain, muscle cramps, myalgias, neck pain and stiffness.  Gastrointestinal: Negative for abdominal pain, bowel incontinence, diarrhea and excessive appetite.  Genitourinary: Negative for decreased libido, genital sores and incomplete emptying.  Neurological: Negative for brief paralysis, focal weakness, headaches and loss of balance.  Psychiatric/Behavioral: Negative for altered mental status, depression and suicidal ideas.  Allergic/Immunologic: Negative for HIV exposure and persistent infections.    EKGs/Labs/Other Studies Reviewed:    The following studies were reviewed today:   EKG: None today  Recent Labs: 03/29/2019: BUN 11; Creatinine, Ser 0.90; Potassium 4.2; Sodium 140  Recent Lipid Panel    Component Value Date/Time   CHOL 175 03/29/2019 0858   TRIG 108 03/29/2019 0858   HDL 37 (L) 03/29/2019 0858   CHOLHDL 4.7 03/29/2019 0858   LDLCALC 118 (H) 03/29/2019 0858    Physical Exam:    VS:  BP (!) 160/96 (BP Location: Left Arm, Patient Position: Sitting, Cuff Size: Normal)   Pulse 84   Ht 5' 8.5" (1.74 m)   Wt 248 lb (112.5 kg)   SpO2 96%   BMI 37.16 kg/m     Wt Readings from Last 3 Encounters:  05/02/19 248 lb (112.5 kg)  03/30/19 249 lb (112.9 kg)  02/24/19 242 lb (109.8 kg)     GEN: Well nourished, well developed in no acute distress HEENT: Normal NECK: No JVD; No carotid bruits LYMPHATICS: No lymphadenopathy CARDIAC: S1S2 noted,RRR, no murmurs, rubs, gallops RESPIRATORY:   Clear to auscultation without rales, wheezing or rhonchi  ABDOMEN: Soft, non-tender, non-distended, +bowel sounds, no guarding. EXTREMITIES: No edema, No cyanosis, no clubbing MUSCULOSKELETAL:  No edema; No deformity  SKIN: Warm and dry NEUROLOGIC:  Alert and oriented x 3, non-focal PSYCHIATRIC:  Normal affect, good insight  ASSESSMENT:    1. Ascending aortic aneurysm ([place])   2. Essential hypertension   3. Mild aortic stenosis   4. TIA (transient ischemic attack)   5. Type 2 diabetes mellitus without complication, without long-term current use of insulin ([place])   6. Mixed  hyperlipidemia   7. Obesity (BMI 30-39.9)    PLAN:    1.  His blood pressure is elevated today in the office.  Therefore at this time going to increase by Bystolic to 20 mg daily.  Therefore he will not be taking amlodipine 5 mg daily, Lasix 80 mg daily, and Bystolic 20 mg daily.  2.  Ascending aortic aneurysm-we will continue aggressive blood pressure control.  Repeat imaging in 6 to 12 months.  3.  Hyperlipidemia, continue lovastatin.  The patient is in agreement with the above plan. The patient left the office in stable condition.  The patient will follow up in 1 month for blood pressure follow-up   Medication Adjustments/Labs and Tests Ordered: Current medicines are reviewed at length with the patient today.  Concerns regarding medicines are outlined above.  No orders of the defined types were placed in this encounter.  No orders of the defined types were placed in this encounter.   There are no Patient Instructions on file for this visit.   Adopting a Healthy Lifestyle.  Know what a healthy weight is for you (roughly BMI <25) and aim to maintain this   Aim for 7+ servings of fruits and vegetables daily   65-80+ fluid ounces of water or unsweet tea for healthy kidneys   Limit to max 1 drink of alcohol per day; avoid smoking/tobacco   Limit animal fats in diet for cholesterol and heart health -  choose grass fed whenever available   Avoid highly processed foods, and foods high in saturated/trans fats   Aim for low stress - take time to unwind and care for your mental health   Aim for 150 min of moderate intensity exercise weekly for heart health, and weights twice weekly for bone health   Aim for 7-9 hours of sleep daily   When it comes to diets, agreement about the perfect plan isnt easy to find, even among the experts. Experts at the [facility] [employer] developed an idea known as the Healthy Eating Plate. Just imagine a plate divided into logical, healthy portions.   The emphasis is on diet quality:   Load up on vegetables and fruits - one-half of your plate: Aim for color and variety, and remember that potatoes dont count.   Go for whole grains - one-quarter of your plate: Whole wheat, barley, wheat berries, quinoa, oats, brown rice, and foods made with them. If you want pasta, go with whole wheat pasta.   Protein power - one-quarter of your plate: Fish, chicken, beans, and nuts are all healthy, versatile protein sources. Limit red meat.   The diet, however, does go beyond the plate, offering a few other suggestions.   Use healthy plant oils, such as olive, canola, soy, corn, sunflower and peanut. Check the labels, and avoid partially hydrogenated oil, which have unhealthy trans fats.   If youre thirsty, drink water. Coffee and tea are good in moderation, but skip sugary drinks and limit milk and dairy products to one or two daily servings.   The type of carbohydrate in the diet is more important than the amount. Some sources of carbohydrates, such as vegetables, fruits, whole grains, and beans-are healthier than others.   Finally, stay active  Signed, Thomasene RippleKardie Mykelti Goldenstein, DO  05/02/2019 1:04 PM    [facility]

## 2019-06-06 ENCOUNTER — Telehealth: Payer: Self-pay | Admitting: Cardiology

## 2019-06-06 NOTE — Telephone Encounter (Signed)
Wants to  Know does he take his Bistolinc at morning or night

## 2019-06-06 NOTE — Telephone Encounter (Signed)
Telephone call to patient. Left message that it does not matter when he takes the Bystolic as long as he takes it at the same time. Patient to call with any more questions.

## 2019-06-07 ENCOUNTER — Ambulatory Visit: Payer: BC Managed Care – PPO | Admitting: Cardiology

## 2019-07-07 DIAGNOSIS — E1165 Type 2 diabetes mellitus with hyperglycemia: Secondary | ICD-10-CM

## 2019-07-07 DIAGNOSIS — U071 COVID-19: Secondary | ICD-10-CM

## 2019-07-07 DIAGNOSIS — E871 Hypo-osmolality and hyponatremia: Secondary | ICD-10-CM

## 2019-07-07 DIAGNOSIS — I352 Nonrheumatic aortic (valve) stenosis with insufficiency: Secondary | ICD-10-CM | POA: Diagnosis not present

## 2019-07-07 DIAGNOSIS — E876 Hypokalemia: Secondary | ICD-10-CM

## 2019-07-08 ENCOUNTER — Other Ambulatory Visit: Payer: Self-pay | Admitting: Physician Assistant

## 2019-07-08 DIAGNOSIS — E119 Type 2 diabetes mellitus without complications: Secondary | ICD-10-CM

## 2019-07-08 DIAGNOSIS — I10 Essential (primary) hypertension: Secondary | ICD-10-CM

## 2019-07-08 DIAGNOSIS — E876 Hypokalemia: Secondary | ICD-10-CM | POA: Diagnosis not present

## 2019-07-08 DIAGNOSIS — U071 COVID-19: Secondary | ICD-10-CM

## 2019-07-08 DIAGNOSIS — E871 Hypo-osmolality and hyponatremia: Secondary | ICD-10-CM | POA: Diagnosis not present

## 2019-07-08 DIAGNOSIS — E1165 Type 2 diabetes mellitus with hyperglycemia: Secondary | ICD-10-CM | POA: Diagnosis not present

## 2019-07-08 DIAGNOSIS — G459 Transient cerebral ischemic attack, unspecified: Secondary | ICD-10-CM

## 2019-07-08 DIAGNOSIS — I350 Nonrheumatic aortic (valve) stenosis: Secondary | ICD-10-CM

## 2019-07-08 NOTE — Progress Notes (Signed)
  I connected by phone with Frederick Gomez on 07/08/2019 at 2:00 PM to discuss the potential use of an new treatment for mild to moderate COVID-19 viral infection in non-hospitalized patients.  This patient is a 59 y.o. male that meets the FDA criteria for Emergency Use Authorization of bamlanivimab or casirivimab\imdevimab.  Has a (+) direct SARS-CoV-2 viral test result  Has mild or moderate COVID-19   Is ? 57 years of age and weighs ? 40 kg  Is NOT hospitalized due to COVID-19  Is NOT requiring oxygen therapy or requiring an increase in baseline oxygen flow rate due to COVID-19  Is within 10 days of symptom onset  Has at least one of the high risk factor(s) for progression to severe COVID-19 and/or hospitalization as defined in EUA.  Specific high risk criteria : Diabetes   HX of HTN   I have spoken and communicated the following to the patient or parent/caregiver:  1. FDA has authorized the emergency use of bamlanivimab and casirivimab\imdevimab for the treatment of mild to moderate COVID-19 in adults and pediatric patients with positive results of direct SARS-CoV-2 viral testing who are 72 years of age and older weighing at least 40 kg, and who are at high risk for progressing to severe COVID-19 and/or hospitalization.  2. The significant known and potential risks and benefits of bamlanivimab and casirivimab\imdevimab, and the extent to which such potential risks and benefits are unknown.  3. Information on available alternative treatments and the risks and benefits of those alternatives, including clinical trials.  4. Patients treated with bamlanivimab and casirivimab\imdevimab should continue to self-isolate and use infection control measures (e.g., wear mask, isolate, social distance, avoid sharing personal items, clean and disinfect "high touch" surfaces, and frequent handwashing) according to CDC guidelines.   5. The patient or parent/caregiver has the option to accept or refuse  bamlanivimab or casirivimab\imdevimab .  After reviewing this information with the patient, The patient agreed to proceed with receiving the bamlanimivab infusion and will be provided a copy of the Fact sheet prior to receiving the infusion.[name] [name] 07/08/2019 2:00 PM

## 2019-07-11 ENCOUNTER — Ambulatory Visit (HOSPITAL_COMMUNITY): Payer: BC Managed Care – PPO

## 2019-07-12 ENCOUNTER — Encounter (HOSPITAL_COMMUNITY): Payer: Self-pay | Admitting: Internal Medicine

## 2019-07-12 ENCOUNTER — Ambulatory Visit (HOSPITAL_COMMUNITY): Payer: BC Managed Care – PPO

## 2019-07-12 ENCOUNTER — Inpatient Hospital Stay (HOSPITAL_COMMUNITY): Payer: BC Managed Care – PPO

## 2019-07-12 ENCOUNTER — Inpatient Hospital Stay (HOSPITAL_COMMUNITY)
Admission: AD | Admit: 2019-07-12 | Discharge: 2019-07-19 | DRG: 177 | Disposition: A | Discharge status: Home Health | Payer: BC Managed Care – PPO | Source: Ambulatory Visit | Attending: Internal Medicine | Admitting: Internal Medicine

## 2019-07-12 ENCOUNTER — Ambulatory Visit (HOSPITAL_COMMUNITY)
Admission: RE | Admit: 2019-07-12 | Discharge: 2019-07-12 | Disposition: A | Discharge status: Home / Self Care | Payer: BC Managed Care – PPO | Source: Ambulatory Visit | Attending: Pulmonary Disease | Admitting: Pulmonary Disease

## 2019-07-12 ENCOUNTER — Other Ambulatory Visit: Payer: Self-pay

## 2019-07-12 DIAGNOSIS — Z833 Family history of diabetes mellitus: Secondary | ICD-10-CM | POA: Diagnosis not present

## 2019-07-12 DIAGNOSIS — Z9119 Patient's noncompliance with other medical treatment and regimen: Secondary | ICD-10-CM

## 2019-07-12 DIAGNOSIS — I119 Hypertensive heart disease without heart failure: Secondary | ICD-10-CM | POA: Diagnosis present

## 2019-07-12 DIAGNOSIS — J1282 Pneumonia due to coronavirus disease 2019: Secondary | ICD-10-CM | POA: Diagnosis present

## 2019-07-12 DIAGNOSIS — Z23 Encounter for immunization: Secondary | ICD-10-CM

## 2019-07-12 DIAGNOSIS — Z888 Allergy status to other drugs, medicaments and biological substances status: Secondary | ICD-10-CM | POA: Diagnosis not present

## 2019-07-12 DIAGNOSIS — R0600 Dyspnea, unspecified: Secondary | ICD-10-CM

## 2019-07-12 DIAGNOSIS — Z7984 Long term (current) use of oral hypoglycemic drugs: Secondary | ICD-10-CM

## 2019-07-12 DIAGNOSIS — Z8249 Family history of ischemic heart disease and other diseases of the circulatory system: Secondary | ICD-10-CM | POA: Diagnosis not present

## 2019-07-12 DIAGNOSIS — G4733 Obstructive sleep apnea (adult) (pediatric): Secondary | ICD-10-CM | POA: Diagnosis present

## 2019-07-12 DIAGNOSIS — J9601 Acute respiratory failure with hypoxia: Secondary | ICD-10-CM | POA: Diagnosis present

## 2019-07-12 DIAGNOSIS — Z6835 Body mass index (BMI) 35.0-35.9, adult: Secondary | ICD-10-CM

## 2019-07-12 DIAGNOSIS — Z91040 Latex allergy status: Secondary | ICD-10-CM

## 2019-07-12 DIAGNOSIS — R0602 Shortness of breath: Secondary | ICD-10-CM | POA: Diagnosis not present

## 2019-07-12 DIAGNOSIS — Z87891 Personal history of nicotine dependence: Secondary | ICD-10-CM

## 2019-07-12 DIAGNOSIS — Z8673 Personal history of transient ischemic attack (TIA), and cerebral infarction without residual deficits: Secondary | ICD-10-CM | POA: Diagnosis not present

## 2019-07-12 DIAGNOSIS — E785 Hyperlipidemia, unspecified: Secondary | ICD-10-CM | POA: Diagnosis present

## 2019-07-12 DIAGNOSIS — Z7902 Long term (current) use of antithrombotics/antiplatelets: Secondary | ICD-10-CM | POA: Diagnosis not present

## 2019-07-12 DIAGNOSIS — E119 Type 2 diabetes mellitus without complications: Secondary | ICD-10-CM | POA: Diagnosis present

## 2019-07-12 DIAGNOSIS — U071 COVID-19: Secondary | ICD-10-CM

## 2019-07-12 HISTORY — DX: COVID-19: U07.1

## 2019-07-12 LAB — COMPREHENSIVE METABOLIC PANEL
ALT: 20 U/L (ref 0–44)
AST: 31 U/L (ref 15–41)
Albumin: 3 g/dL — ABNORMAL LOW (ref 3.5–5.0)
Alkaline Phosphatase: 85 U/L (ref 38–126)
Anion gap: 15 (ref 5–15)
BUN: 12 mg/dL (ref 6–20)
CO2: 25 mmol/L (ref 22–32)
Calcium: 8.3 mg/dL — ABNORMAL LOW (ref 8.9–10.3)
Chloride: 96 mmol/L — ABNORMAL LOW (ref 98–111)
Creatinine, Ser: 1.1 mg/dL (ref 0.61–1.24)
GFR calc Af Amer: >60 mL/min (ref >60)
GFR calc non Af Amer: >60 mL/min (ref >60)
Glucose, Bld: 120 mg/dL — ABNORMAL HIGH (ref 70–99)
Potassium: 3.3 mmol/L — ABNORMAL LOW (ref 3.5–5.1)
Sodium: 136 mmol/L (ref 135–145)
Total Bilirubin: 1.1 mg/dL (ref 0.3–1.2)
Total Protein: 7 g/dL (ref 6.5–8.1)

## 2019-07-12 LAB — BASIC METABOLIC PANEL
Anion gap: 12 (ref 5–15)
BUN: 12 mg/dL (ref 6–20)
CO2: 27 mmol/L (ref 22–32)
Calcium: 8.3 mg/dL — ABNORMAL LOW (ref 8.9–10.3)
Chloride: 94 mmol/L — ABNORMAL LOW (ref 98–111)
Creatinine, Ser: 1.13 mg/dL (ref 0.61–1.24)
GFR calc Af Amer: >60 mL/min (ref >60)
GFR calc non Af Amer: >60 mL/min (ref >60)
Glucose, Bld: 125 mg/dL — ABNORMAL HIGH (ref 70–99)
Potassium: 3.1 mmol/L — ABNORMAL LOW (ref 3.5–5.1)
Sodium: 133 mmol/L — ABNORMAL LOW (ref 135–145)

## 2019-07-12 LAB — D-DIMER, QUANTITATIVE: D-Dimer, Quant: 1.52 ug/mL-FEU — ABNORMAL HIGH (ref 0.00–0.50)

## 2019-07-12 LAB — CBC
HCT: 44.6 % (ref 39.0–52.0)
Hemoglobin: 14.9 g/dL (ref 13.0–17.0)
MCH: 28.1 pg (ref 26.0–34.0)
MCHC: 33.4 g/dL (ref 30.0–36.0)
MCV: 84 fL (ref 80.0–100.0)
Platelets: 263 10*3/uL (ref 150–400)
RBC: 5.31 MIL/uL (ref 4.22–5.81)
RDW: 12.4 % (ref 11.5–15.5)
WBC: 8.4 10*3/uL (ref 4.0–10.5)
nRBC: 0 % (ref 0.0–0.2)

## 2019-07-12 LAB — HEMOGLOBIN A1C
Hgb A1c MFr Bld: 8.6 % — ABNORMAL HIGH (ref 4.8–5.6)
Mean Plasma Glucose: 200.12 mg/dL

## 2019-07-12 LAB — C-REACTIVE PROTEIN: CRP: 25.2 mg/dL — ABNORMAL HIGH (ref <1.0)

## 2019-07-12 LAB — ABO/RH: ABO/RH(D): O POS

## 2019-07-12 LAB — BRAIN NATRIURETIC PEPTIDE: B Natriuretic Peptide: 47.3 pg/mL (ref 0.0–100.0)

## 2019-07-12 LAB — GLUCOSE, CAPILLARY
Glucose-Capillary: 103 mg/dL — ABNORMAL HIGH (ref 70–99)
Glucose-Capillary: 323 mg/dL — ABNORMAL HIGH (ref 70–99)

## 2019-07-12 LAB — MAGNESIUM: Magnesium: 2 mg/dL (ref 1.7–2.4)

## 2019-07-12 LAB — HEPATITIS B SURFACE ANTIGEN: Hepatitis B Surface Ag: NONREACTIVE

## 2019-07-12 LAB — HIV ANTIBODY (ROUTINE TESTING W REFLEX): HIV Screen 4th Generation wRfx: NONREACTIVE

## 2019-07-12 LAB — PROCALCITONIN: Procalcitonin: 0.31 ng/mL

## 2019-07-12 MED ORDER — POTASSIUM CHLORIDE CRYS ER 20 MEQ PO TBCR
40.0000 meq | EXTENDED_RELEASE_TABLET | Freq: Once | ORAL | Status: AC
  Administered 2019-07-12: 19:00:00 40 meq via ORAL
  Filled 2019-07-12: qty 2

## 2019-07-12 MED ORDER — NEBIVOLOL HCL 10 MG PO TABS
20.0000 mg | ORAL_TABLET | Freq: Every day | ORAL | Status: DC
  Administered 2019-07-12 – 2019-07-19 (×8): 20 mg via ORAL
  Filled 2019-07-12 (×9): qty 2

## 2019-07-12 MED ORDER — PRAVASTATIN SODIUM 10 MG PO TABS: 10.0000 mg | ORAL_TABLET | Freq: Every day | ORAL | Status: DC

## 2019-07-12 MED ORDER — INFLUENZA VAC SPLIT QUAD 0.5 ML IM SUSY
0.5000 mL | PREFILLED_SYRINGE | INTRAMUSCULAR | Status: AC
  Administered 2019-07-16: 12:00:00 0.5 mL via INTRAMUSCULAR
  Filled 2019-07-12 (×2): qty 0.5

## 2019-07-12 MED ORDER — ENOXAPARIN SODIUM 60 MG/0.6ML ~~LOC~~ SOLN
55.0000 mg | SUBCUTANEOUS | Status: DC
  Administered 2019-07-12 – 2019-07-18 (×7): 55 mg via SUBCUTANEOUS
  Filled 2019-07-12 (×6): qty 0.6

## 2019-07-12 MED ORDER — ONDANSETRON HCL 4 MG/2ML IJ SOLN: 4.0000 mg | Freq: Four times a day (QID) | INTRAMUSCULAR | Status: DC | PRN

## 2019-07-12 MED ORDER — INSULIN ASPART 100 UNIT/ML ~~LOC~~ SOLN
0.0000 [IU] | Freq: Three times a day (TID) | SUBCUTANEOUS | Status: DC
  Administered 2019-07-13: 7 [IU] via SUBCUTANEOUS
  Administered 2019-07-13: 17:00:00 20 [IU] via SUBCUTANEOUS
  Administered 2019-07-13: 13:00:00 15 [IU] via SUBCUTANEOUS
  Administered 2019-07-14: 12:00:00 20 [IU] via SUBCUTANEOUS
  Administered 2019-07-14 (×2): 11 [IU] via SUBCUTANEOUS
  Administered 2019-07-15 (×2): 4 [IU] via SUBCUTANEOUS
  Administered 2019-07-15 – 2019-07-16 (×2): 7 [IU] via SUBCUTANEOUS
  Administered 2019-07-16 (×2): 4 [IU] via SUBCUTANEOUS
  Administered 2019-07-17: 08:00:00 3 [IU] via SUBCUTANEOUS
  Administered 2019-07-17: 12:00:00 7 [IU] via SUBCUTANEOUS
  Administered 2019-07-17: 17:00:00 4 [IU] via SUBCUTANEOUS
  Administered 2019-07-18: 18:00:00 3 [IU] via SUBCUTANEOUS
  Administered 2019-07-19: 13:00:00 7 [IU] via SUBCUTANEOUS

## 2019-07-12 MED ORDER — METFORMIN HCL 500 MG PO TABS
1000.0000 mg | ORAL_TABLET | Freq: Every day | ORAL | Status: DC
  Administered 2019-07-12 – 2019-07-18 (×7): 1000 mg via ORAL
  Filled 2019-07-12 (×7): qty 2

## 2019-07-12 MED ORDER — PRIMIDONE 250 MG PO TABS
250.0000 mg | ORAL_TABLET | Freq: Every day | ORAL | Status: DC
  Administered 2019-07-12 – 2019-07-19 (×8): 250 mg via ORAL
  Filled 2019-07-12 (×8): qty 1
  Filled 2019-07-12: qty 5
  Filled 2019-07-12: qty 1

## 2019-07-12 MED ORDER — SODIUM CHLORIDE 0.9 % IV SOLN
100.0000 mg | Freq: Every day | INTRAVENOUS | Status: AC
  Administered 2019-07-13 – 2019-07-16 (×4): 100 mg via INTRAVENOUS
  Filled 2019-07-12 (×5): qty 20

## 2019-07-12 MED ORDER — TOCILIZUMAB 400 MG/20ML IV SOLN
800.0000 mg | Freq: Once | INTRAVENOUS | Status: AC
  Administered 2019-07-12: 19:00:00 800 mg via INTRAVENOUS
  Filled 2019-07-12: qty 40

## 2019-07-12 MED ORDER — DOCUSATE SODIUM 100 MG PO CAPS
200.0000 mg | ORAL_CAPSULE | Freq: Two times a day (BID) | ORAL | Status: DC
  Administered 2019-07-12 – 2019-07-19 (×10): 200 mg via ORAL
  Filled 2019-07-12 (×12): qty 2

## 2019-07-12 MED ORDER — SODIUM CHLORIDE 0.9 % IV SOLN
200.0000 mg | Freq: Once | INTRAVENOUS | Status: AC
  Administered 2019-07-12: 17:00:00 200 mg via INTRAVENOUS
  Filled 2019-07-12: qty 40

## 2019-07-12 MED ORDER — INSULIN ASPART 100 UNIT/ML ~~LOC~~ SOLN
0.0000 [IU] | Freq: Every day | SUBCUTANEOUS | Status: DC
  Administered 2019-07-12: 4 [IU] via SUBCUTANEOUS
  Administered 2019-07-13: 22:00:00 3 [IU] via SUBCUTANEOUS
  Administered 2019-07-14 – 2019-07-15 (×2): 2 [IU] via SUBCUTANEOUS
  Administered 2019-07-16: 21:00:00 3 [IU] via SUBCUTANEOUS
  Administered 2019-07-18: 21:00:00 2 [IU] via SUBCUTANEOUS

## 2019-07-12 MED ORDER — AMLODIPINE BESYLATE 5 MG PO TABS
5.0000 mg | ORAL_TABLET | Freq: Every day | ORAL | Status: DC
  Administered 2019-07-12 – 2019-07-19 (×8): 5 mg via ORAL
  Filled 2019-07-12 (×8): qty 1

## 2019-07-12 MED ORDER — METHYLPREDNISOLONE SODIUM SUCC 125 MG IJ SOLR
60.0000 mg | Freq: Two times a day (BID) | INTRAMUSCULAR | Status: DC
  Administered 2019-07-12 – 2019-07-16 (×8): 60 mg via INTRAVENOUS
  Filled 2019-07-12 (×8): qty 2

## 2019-07-12 MED ORDER — EMPAGLIFLOZIN 10 MG PO TABS: 10.0000 mg | ORAL_TABLET | Freq: Every day | ORAL | Status: DC

## 2019-07-12 MED ORDER — CLOPIDOGREL BISULFATE 75 MG PO TABS
75.0000 mg | ORAL_TABLET | Freq: Every day | ORAL | Status: DC
  Administered 2019-07-12 – 2019-07-19 (×8): 75 mg via ORAL
  Filled 2019-07-12 (×8): qty 1

## 2019-07-12 MED ORDER — FUROSEMIDE 20 MG PO TABS
80.0000 mg | ORAL_TABLET | Freq: Every day | ORAL | Status: DC
  Administered 2019-07-12 – 2019-07-14 (×3): 80 mg via ORAL
  Filled 2019-07-12 (×3): qty 4

## 2019-07-12 MED ORDER — ACETAMINOPHEN 325 MG PO TABS
650.0000 mg | ORAL_TABLET | Freq: Four times a day (QID) | ORAL | Status: DC | PRN
  Administered 2019-07-12 – 2019-07-18 (×2): 650 mg via ORAL
  Filled 2019-07-12 (×2): qty 2

## 2019-07-12 MED ORDER — ALBUTEROL SULFATE HFA 108 (90 BASE) MCG/ACT IN AERS
2.0000 | INHALATION_SPRAY | Freq: Four times a day (QID) | RESPIRATORY_TRACT | Status: DC | PRN
  Filled 2019-07-12: qty 6.7

## 2019-07-12 NOTE — H&P (Signed)
TRH H&P   Patient Demographics:    Frederick Gomez, is a 59 y.o. male  MRN: [id number]   DOB - 01/24/1961  Admit Date - 07/12/2019  Outpatient Primary MD for the patient is Ninfa Meeker, FNP  Patient coming from: Remdesivir infusion clinic via home  Shortness of breath at the remdesivir infusion clinic   HPI:    Frederick Gomez  is a 59 y.o. male, with history of obesity, OSA noncompliant with CPAP, hypertension, TIA, DM type II who was diagnosed with COVID-19 pneumonia about 8 days ago and subsequently started experiencing some shortness of breath, he gradually became progressively short of breath and today came to the outpatient remdesivir clinic where he was found to have a pulse ox of 78% on room air.  He was also breathing between 35 to 50 breaths/min.  He was admitted directly to [facility] for further care.  Currently besides his shortness of breath and fevers he denies any headache, no chest pain, no abdominal pain, no productive cough, no dysuria or focal weakness.   Review of systems:      A full 10 point Review of Systems was done, except as stated above, all other Review of Systems were negative.   With Past History of the following :    Past Medical History:  Diagnosis Date  . Diabetes (HCC)   . Hyperlipidemia   . Hypertension   . Obese   . OSA on CPAP   . Seasonal allergies   . TIA (transient ischemic attack)       Past Surgical History:  Procedure Laterality Date  . CARDIAC CATHETERIZATION    . HEMORROIDECTOMY    . NOSE SURGERY        Social History:     Social History   Tobacco Use  . Smoking status: Former Smoker    Years: 3.00    Types: Cigarettes    Quit date: 06/10/1979    Years  since quitting: 40.1  . Smokeless tobacco: Never Used  Substance Use Topics  . Alcohol use: Never         Family History :     Family History  Problem Relation Age of Onset  . Diabetes Mother   . Heart attack Mother   . Suicidality Father        Home Medications:  Prior to Admission medications   Medication Sig Start Date End Date Taking? Authorizing Provider  amLODipine (NORVASC) 5 MG tablet Take 5 mg by mouth daily.  08/01/15   [provider]  clopidogrel (PLAVIX) 75 MG tablet Take 75 mg by mouth daily.  08/15/15   [provider]  empagliflozin (JARDIANCE) 10 MG TABS tablet Take 10 mg by mouth daily.    [provider]  furosemide (LASIX) 80 MG tablet Take 80 mg by mouth daily.    [provider]  lovastatin (MEVACOR) 20 MG tablet Take 20 mg by mouth at bedtime.  08/22/15   [provider]  metFORMIN (GLUCOPHAGE) 500 MG tablet Take 1,000 mg by mouth at bedtime.  11/03/13   [provider]  Nebivolol HCl 20 MG TABS Take 1 tablet (20 mg total) by mouth daily. 05/02/19   Tobb, Kardie, DO  polycarbophil (FIBERCON) 625 MG tablet Take 625 mg by mouth as needed.     [provider]  primidone (MYSOLINE) 250 MG tablet Take 250 mg by mouth daily.  09/03/15   [provider]     Allergies:     Allergies  Allergen Reactions  . Hydrochlorothiazide   . Niaspan [Niacin]   . Pravastatin   . Simvastatin   . Latex Rash     Physical Exam:   Vitals  Blood pressure (!) 143/79, pulse 88, temperature (!) 101 F (38.3 C), temperature source Oral, resp. rate (!) 49, height 5' 8.5" (1.74 m), weight 108.5 kg, SpO2 95 %.   1. General obese middle-aged Caucasian male lying in hospital bed in mild respiratory distress on oxygen,  2. Normal affect and insight, Not Suicidal or Homicidal, Awake Alert, Oriented X 3.  3. No F.N deficits, ALL C.Nerves Intact, Strength 5/5 all 4 extremities, Sensation intact all 4  extremities, Plantars down going.  4. Ears and Eyes appear Normal, Conjunctivae clear, PERRLA. Moist Oral Mucosa.  5. Supple Neck, No JVD, No cervical lymphadenopathy appriciated, No Carotid Bruits.  6. Symmetrical Chest wall movement, Good air movement bilaterally, CTAB.  7. RRR, No Gallops, Rubs or Murmurs, No Parasternal Heave.  8. Positive Bowel Sounds, Abdomen Soft, No tenderness, No organomegaly appriciated,No rebound -guarding or rigidity.  9.  No Cyanosis, Normal Skin Turgor, No Skin Rash or Bruise.  10. Good muscle tone,  joints appear normal , no effusions, Normal ROM.  11. No Palpable Lymph Nodes in Neck or Axillae      Data Review:    CBC No results for input(s): WBC, HGB, HCT, PLT, MCV, MCH, MCHC, RDW, LYMPHSABS, MONOABS, EOSABS, BASOSABS, BANDABS in the last 168 hours.  Invalid input(s): NEUTRABS, BANDSABD ------------------------------------------------------------------------------------------------------------------  Chemistries  No results for input(s): NA, K, CL, CO2, GLUCOSE, BUN, CREATININE, CALCIUM, MG, AST, ALT, ALKPHOS, BILITOT in the last 168 hours.  Invalid input(s): GFRCGP ------------------------------------------------------------------------------------------------------------------ CrCl cannot be calculated (Patient's most recent lab result is older than the maximum 21 days allowed.). ------------------------------------------------------------------------------------------------------------------ No results for input(s): TSH, T4TOTAL, T3FREE, THYROIDAB in the last 72 hours.  Invalid input(s): FREET3  Coagulation profile No results for input(s): INR, PROTIME in the last 168 hours. ------------------------------------------------------------------------------------------------------------------- No results for input(s): DDIMER in the last 72  hours. -------------------------------------------------------------------------------------------------------------------  Cardiac Enzymes No results for input(s): CKMB, TROPONINI, MYOGLOBIN in the last 168 hours.  Invalid input(s): CK ------------------------------------------------------------------------------------------------------------------ No results found for: BNP   ---------------------------------------------------------------------------------------------------------------  Urinalysis No results found for: COLORURINE, APPEARANCEUR, LABSPEC, PHURINE, GLUCOSEU, HGBUR, BILIRUBINUR, KETONESUR, PROTEINUR, UROBILINOGEN, NITRITE, LEUKOCYTESUR  ----------------------------------------------------------------------------------------------------------------  Imaging Results:    DG Chest Port 1 View  Result Date: 07/12/2019 CLINICAL DATA:  Shortness of breath. EXAM: PORTABLE CHEST 1 VIEW COMPARISON:  July 07, 2019. FINDINGS: Stable cardiomegaly. No pneumothorax or pleural effusion is noted. Interval development of diffuse airspace opacities are noted throughout both lungs most consistent with multifocal pneumonia. Bony thorax is unremarkable. IMPRESSION: Interval development of diffuse airspace opacities throughout both lungs most consistent with multifocal pneumonia. Electronically Signed   By: Marijo Conception M.D.   On: 07/12/2019 15:45        Assessment & Plan:     1.  Severe acute hypoxic respiratory failure due to COVID-19 pneumonia.  Patient currently in moderate to severe distress, breathing above 40 breaths/min, currently on 5 L nasal cannula oxygen to maintain has oxygen saturations.  Blood work is pending.  For now I will be starting him on IV steroids and remdesivir, if CRP appears elevated will give him Actemra promptly.  He has consented for it.  He clearly appears to be having severe disease and hopefully his parenchymal damage is not too advanced, which he had seek  medical attention a few days earlier.  Actemra off label use - patient was told that if COVID-19 pneumonitis gets worse we might potentially use Actemra off label, she denies any known history of tuberculosis or hepatitis, no active Diverticulitis or known other active infections, understands the risks and benefits and wants to proceed with Actemra treatment if required.   2.  Morbid obesity and OSA.  Noncompliant with CPAP, counseled, oxygen here, follow with PCP for obesity.  BMI 35.  3.  Essential hypertension.  On combination of beta-blocker and Norvasc continue.  4.  Dyslipidemia.  Not taking statin due to allergy.  Monitor and defer to PCP.  5.  History of TIA.  Continue Plavix, not taking statin anymore.  Outpatient secondary prevention by PCP.  6.  DM type II.  Will check A1c, for now moderate sliding scale.  May require addition of Lantus and premeal NovoLog based on his steroid response and sugar levels.    DVT Prophylaxis   Lovenox    AM Labs Ordered, also please review Full Orders  Family Communication: Admission, patients condition and plan of care including tests being ordered have been discussed with the patient  who indicates understanding and agree with the plan and Code Status.  Code Status Full  Likely DC to  TBD  Condition GUARDED    Consults called: None    Admission status: Inpt    Time spent in minutes : 35   Lala Lund M.D on 07/12/2019 at 4:39 PM  To page go to [email or web address] - password [facility]

## 2019-07-12 NOTE — Progress Notes (Signed)
Since Jardiance is restricted here, ok to hold per Dr. Thedore Mins and increase lovenox to 0.5mg /kg/day.   Ulyses Southward, PharmD, BCIDP, AAHIVP, CPP Infectious Disease Pharmacist 07/12/2019 4:04 PM

## 2019-07-12 NOTE — Progress Notes (Signed)
Add Actemra per Dr Thedore Mins. We will add on a CMP to get ALT and hep B titers.  Actemra 800mg  IV x1  , PharmD, BCIDP, AAHIVP, CPP Infectious Disease Pharmacist 07/12/2019 6:30 PM

## 2019-07-12 NOTE — Progress Notes (Addendum)
Patient arrived at the infusion clinic to receive Bamlanivimab. Oxygen saturation 75% on room air, resp 36. Patient placed on 4L of O2, stating at 93%. MD made aware.  Dr. Thedore Mins is admitting patient and brief report was given to [facility], RN in room 173.

## 2019-07-12 NOTE — Progress Notes (Signed)
Patient wanted boss Molli Hazard updated.    07/12/19 1808  Family/Significant Other Communication  Family/Significant Other Update Called;Updated Molli Hazard)

## 2019-07-13 LAB — CBC WITH DIFFERENTIAL/PLATELET
Abs Immature Granulocytes: 0 10*3/uL (ref 0.00–0.07)
Basophils Absolute: 0 10*3/uL (ref 0.0–0.1)
Basophils Relative: 0 %
Eosinophils Absolute: 0 10*3/uL (ref 0.0–0.5)
Eosinophils Relative: 0 %
HCT: 43.8 % (ref 39.0–52.0)
Hemoglobin: 14.7 g/dL (ref 13.0–17.0)
Lymphocytes Relative: 2 %
Lymphs Abs: 0.1 10*3/uL — ABNORMAL LOW (ref 0.7–4.0)
MCH: 28.7 pg (ref 26.0–34.0)
MCHC: 33.6 g/dL (ref 30.0–36.0)
MCV: 85.4 fL (ref 80.0–100.0)
Monocytes Absolute: 0.1 10*3/uL (ref 0.1–1.0)
Monocytes Relative: 1 %
Neutro Abs: 5.8 10*3/uL (ref 1.7–7.7)
Neutrophils Relative %: 97 %
Platelets: 267 10*3/uL (ref 150–400)
RBC: 5.13 MIL/uL (ref 4.22–5.81)
RDW: 12.5 % (ref 11.5–15.5)
WBC: 6 10*3/uL (ref 4.0–10.5)
nRBC: 0 % (ref 0.0–0.2)

## 2019-07-13 LAB — COMPREHENSIVE METABOLIC PANEL
ALT: 18 U/L (ref 0–44)
AST: 20 U/L (ref 15–41)
Albumin: 2.8 g/dL — ABNORMAL LOW (ref 3.5–5.0)
Alkaline Phosphatase: 82 U/L (ref 38–126)
Anion gap: 12 (ref 5–15)
BUN: 20 mg/dL (ref 6–20)
CO2: 27 mmol/L (ref 22–32)
Calcium: 8.6 mg/dL — ABNORMAL LOW (ref 8.9–10.3)
Chloride: 98 mmol/L (ref 98–111)
Creatinine, Ser: 0.98 mg/dL (ref 0.61–1.24)
GFR calc Af Amer: >60 mL/min (ref >60)
GFR calc non Af Amer: >60 mL/min (ref >60)
Glucose, Bld: 252 mg/dL — ABNORMAL HIGH (ref 70–99)
Potassium: 4.4 mmol/L (ref 3.5–5.1)
Sodium: 137 mmol/L (ref 135–145)
Total Bilirubin: 0.6 mg/dL (ref 0.3–1.2)
Total Protein: 7 g/dL (ref 6.5–8.1)

## 2019-07-13 LAB — PROCALCITONIN: Procalcitonin: 0.28 ng/mL

## 2019-07-13 LAB — D-DIMER, QUANTITATIVE: D-Dimer, Quant: 1.16 ug/mL-FEU — ABNORMAL HIGH (ref 0.00–0.50)

## 2019-07-13 LAB — GLUCOSE, CAPILLARY
Glucose-Capillary: 202 mg/dL — ABNORMAL HIGH (ref 70–99)
Glucose-Capillary: 338 mg/dL — ABNORMAL HIGH (ref 70–99)
Glucose-Capillary: 399 mg/dL — ABNORMAL HIGH (ref 70–99)

## 2019-07-13 LAB — BRAIN NATRIURETIC PEPTIDE: B Natriuretic Peptide: 94.5 pg/mL (ref 0.0–100.0)

## 2019-07-13 LAB — MAGNESIUM: Magnesium: 2.4 mg/dL (ref 1.7–2.4)

## 2019-07-13 LAB — C-REACTIVE PROTEIN: CRP: 26.6 mg/dL — ABNORMAL HIGH (ref <1.0)

## 2019-07-13 LAB — HEPATITIS B SURFACE ANTIBODY, QUANTITATIVE: Hep B S AB Quant (Post): <3.1 m[IU]/mL — ABNORMAL LOW (ref >9.9)

## 2019-07-13 MED ORDER — METHYLPREDNISOLONE SODIUM SUCC 125 MG IJ SOLR: 125.0000 mg | Freq: Once | INTRAMUSCULAR | Status: DC | PRN

## 2019-07-13 MED ORDER — INSULIN GLARGINE 100 UNIT/ML ~~LOC~~ SOLN
10.0000 [IU] | Freq: Every day | SUBCUTANEOUS | Status: DC
  Administered 2019-07-13: 22:00:00 10 [IU] via SUBCUTANEOUS
  Filled 2019-07-13: qty 0.1

## 2019-07-13 MED ORDER — TOCILIZUMAB 400 MG/20ML IV SOLN
800.0000 mg | Freq: Once | INTRAVENOUS | Status: AC
  Administered 2019-07-13: 18:00:00 800 mg via INTRAVENOUS
  Filled 2019-07-13: qty 40

## 2019-07-13 MED ORDER — DIPHENHYDRAMINE HCL 50 MG/ML IJ SOLN: 50.0000 mg | Freq: Once | INTRAMUSCULAR | Status: DC | PRN

## 2019-07-13 MED ORDER — SODIUM CHLORIDE 0.9 % IV SOLN: 700.0000 mg | Freq: Once | INTRAVENOUS | Status: DC

## 2019-07-13 MED ORDER — FAMOTIDINE IN NACL 20-0.9 MG/50ML-% IV SOLN: 20.0000 mg | Freq: Once | INTRAVENOUS | Status: DC | PRN

## 2019-07-13 MED ORDER — EPINEPHRINE 0.3 MG/0.3ML IJ SOAJ: 0.3000 mg | Freq: Once | INTRAMUSCULAR | Status: DC | PRN

## 2019-07-13 MED ORDER — SODIUM CHLORIDE 0.9 % IV SOLN: INTRAVENOUS | Status: DC | PRN

## 2019-07-13 NOTE — Plan of Care (Signed)
  Problem: Acute Rehab OT Goals (only OT should resolve) Goal: Pt. Will Perform Lower Body Dressing 07/13/2019 1413 by Lorre Munroe, OT Flowsheets (Taken 07/13/2019 1413) Pt Will Perform Lower Body Dressing:  with modified independence  sit to/from stand  Goal: Pt. Will Transfer To Toilet 07/13/2019 1413 by Lorre Munroe, OT Flowsheets (Taken 07/13/2019 1413) Pt Will Transfer to Toilet:  ambulating  with modified independence  Goal: Pt. Will Perform Toileting-Clothing Manipulation 07/13/2019 1413 by Lorre Munroe, OT Flowsheets (Taken 07/13/2019 1413) Pt Will Perform Toileting - Clothing Manipulation and hygiene:  with modified independence  sit to/from stand  Goal: Pt/Caregiver Will Perform Home Exercise Program 07/13/2019 1413 by Lorre Munroe, OT Flowsheets (Taken 07/13/2019 1411) Pt/caregiver will Perform Home Exercise Program:  Increased strength  With theraband  Independently  Goal: OT Additional ADL Goal #1 07/13/2019 1413 by Lorre Munroe, OT Flowsheets (Taken 07/13/2019 1411) Additional ADL Goal #1: Pt will demonstrate a minimum of 3 energy conservation strategies Independently during ADLs/IADLs in order to maintain O2 stats above 90%

## 2019-07-13 NOTE — Progress Notes (Signed)
Inpatient Diabetes Program Recommendations  AACE/ADA: New Consensus Statement on Inpatient Glycemic Control   Target Ranges:  Prepandial:   less than 140 mg/dL      Peak postprandial:   less than 180 mg/dL (1-2 hours)      Critically ill patients:  140 - 180 mg/dL  Results for [name], [name] (MRN [id number]) as of 07/13/2019 10:01  Ref. Range 07/13/2019 02:10  Glucose Latest Ref Range: 70 - 99 mg/dL 071 (H)   Results for [name], [name] (MRN [id number]) as of 07/13/2019 10:01  Ref. Range 07/12/2019 15:27 07/12/2019 21:57  Glucose-Capillary Latest Ref Range: 70 - 99 mg/dL 012 (H) 393 (H)   Review of Glycemic Control  Diabetes history: DM2 Outpatient Diabetes medications: Jardiance 10 mg, Metformin 1000 mg/dl Current orders for Inpatient glycemic control: Novolog 0-20 units TID with meals, Novolog 0-5 units QHS, Metformin 1000 mg QAM; Solumedrol 60 mg Q12H  Inpatient Diabetes Program Recommendations:   Insulin - Basal: Fasting glucose 252 mg this morning. May want to consider ordering basal insulin while ordered steroids.  Thanks, Frederick Penner, RN, MSN, CDE Diabetes Coordinator Inpatient Diabetes Program [phone] (Team Pager from 8am to 5pm)

## 2019-07-13 NOTE — Progress Notes (Addendum)
PROGRESS NOTE    Frederick Gomez  IDP:[id number] DOB: 1960-09-05 DOA: 07/12/2019 PCP: Leilani Able, FNP    Brief Narrative:   Frederick Gomez is a 59 y.o. male with past medical history remarkable for obesity, OSA noncompliant with CPAP, hypertension, TIA, DM type II who was diagnosed with COVID-19 pneumonia about 8 days ago who [facility] with progressive shortness of breath and was found to be hypoxic with an SPO2 of 78% on room air. He was also noted to be tachypneic breathing between 35 and 50 breaths/min. He was admitted directly to [facility] for further care.    Assessment & Plan:   Active Problems:   COVID-19 virus infection  Acute hypoxic respiratory failure secondary to acute Covid-19 viral pneumonia during the ongoing 2020 Covid 19 Pandemic - POA Patient presenting from [facility] with tachypnea and was found to be hypoxic oxygenating 78% on room air. Chest x-ray with interval development of diffuse airspace opacities throughout both lungs consistent with multifocal pneumonia. --ddimer 1.52-->1.16 --CRP 25.2-->26.6 --s/p Actemra on 07/12/2019; will repeat dose today --Continue remdesivir, plan 5-day course; day #2/5 --Continue Solu-Medrol 60 mg IV every 12 hours --Continue supplemental oxygen, titrate to maintain SPO2 greater than 92%; currently on 15 L NRB --Continue supportive care with vitamin C, zinc, Tylenol --Follow CBC, CMP, D-dimer, ferritin, and CRP daily --Continue airborne/contact isolation precautions  Morbid obesity Obstructive sleep apnea Body mass index is 35.84 kg/m. Discussed with patient needs for aggressive weight loss measures and lifestyle changes as this complicates all facets of care. Also noncompliant with his home CPAP, counseled on need for use. --Continue supplemental oxygen while inpatient, currently on 15 L NRB  Essential hypertension: BP 124/77, well controlled. --Continue amlodipine 5 mg p.o. daily, furosemide  80 mg p.o. daily, Bystolic 20 mg p.o. daily  Dyslipidemia: Currently not on statin due to allergy, follow-up with PCP.  History of TIA: Continue Plavix; not on statin due to allergy.  Type 2 diabetes mellitus A1c 8.6, not optimally controlled. On Jardiance and Metformin outpatient. --Continue Metformin 1000 mg p.o. daily --Start Lantus 10 units subcutaneously daily given elevated blood sugars in the setting of steroid use --Continue insulin sliding scale for coverage    DVT prophylaxis: Lovenox Code Status: Full code Family Communication: Updated patient extensively at bedside Disposition Plan: From home, to be determined awaiting therapy evaluation; determine if her discharge is high-level of supplemental oxygen need.   Consultants:   None  Procedures:   None  Antimicrobials:   None   Subjective:  Patient seen and examined bedside, resting comfortably. Continues with significant dyspnea especially with any type of exertion. Also reports nonproductive cough. No other specific complaints this morning. Denies fever, no headache, no chills/night sweats, no nausea/vomiting/diarrhea, no chest pain, no palpitations, no abdominal pain. No acute events overnight per nursing staff.  Objective: Vitals:   07/13/19 0730 07/13/19 0800 07/13/19 1152 07/13/19 1204  BP: 137/80  116/75   Pulse: 63  82   Resp: 19  (!) 32 (!) 22  Temp: 97.9 F (36.6 C)  97.9 F (36.6 C)   TempSrc: Oral  Oral   SpO2: 90% (!) 89% (!) 83% (!) 88%  Weight:      Height:        Intake/Output Summary (Last 24 hours) at 07/13/2019 1326 Last data filed at 07/13/2019 1230 Gross per 24 hour  Intake 1125.97 ml  Output 1000 ml  Net 125.97 ml   [employer]  07/12/19 1525  Weight: 108.5 kg    Examination:  General exam: Appears calm and comfortable  Respiratory system: Clear to auscultation. Slight increased respiratory effort, no accessory muscle use, on 15 L NRB oxygenating 91% Cardiovascular  system: S1 & S2 heard, RRR. No JVD, murmurs, rubs, gallops or clicks. No pedal edema. Gastrointestinal system: Abdomen is nondistended, soft and nontender. No organomegaly or masses felt. Normal bowel sounds heard. Central nervous system: Alert and oriented. No focal neurological deficits. Extremities: Symmetric 5 x 5 power. Skin: No rashes, lesions or ulcers Psychiatry: Judgement and insight appear normal. Mood & affect appropriate.     Data Reviewed: I have personally reviewed following labs and imaging studies  CBC: Recent Labs  Lab 07/12/19 1552 07/13/19 0210  WBC 8.4 6.0  NEUTROABS  --  5.8  HGB 14.9 14.7  HCT 44.6 43.8  MCV 84.0 85.4  PLT 263 267   Basic Metabolic Panel: Recent Labs  Lab 07/12/19 1552 07/12/19 1717 07/13/19 0210  NA 133* 136 137  K 3.1* 3.3* 4.4  CL 94* 96* 98  CO2 27 25 27   GLUCOSE 125* 120* 252*  BUN 12 12 20   CREATININE 1.13 1.10 0.98  CALCIUM 8.3* 8.3* 8.6*  MG 2.0  --  2.4   GFR: Estimated Creatinine Clearance: 101.4 mL/min (by C-G formula based on SCr of 0.98 mg/dL). Liver Function Tests: Recent Labs  Lab 07/12/19 1717 07/13/19 0210  AST 31 20  ALT 20 18  ALKPHOS 85 82  BILITOT 1.1 0.6  PROT 7.0 7.0  ALBUMIN 3.0* 2.8*   No results for input(s): LIPASE, AMYLASE in the last 168 hours. No results for input(s): AMMONIA in the last 168 hours. Coagulation Profile: No results for input(s): INR, PROTIME in the last 168 hours. Cardiac Enzymes: No results for input(s): CKTOTAL, CKMB, CKMBINDEX, TROPONINI in the last 168 hours. BNP (last 3 results) No results for input(s): PROBNP in the last 8760 hours. HbA1C: Recent Labs    07/12/19 1552  HGBA1C 8.6*   CBG: Recent Labs  Lab 07/12/19 1527 07/12/19 2157 07/13/19 0740  GLUCAP 103* 323* 202*   Lipid Profile: No results for input(s): CHOL, HDL, LDLCALC, TRIG, CHOLHDL, LDLDIRECT in the last 72 hours. Thyroid Function Tests: No results for input(s): TSH, T4TOTAL, FREET4,  T3FREE, THYROIDAB in the last 72 hours. Anemia Panel: No results for input(s): VITAMINB12, FOLATE, FERRITIN, TIBC, IRON, RETICCTPCT in the last 72 hours. Sepsis Labs: Recent Labs  Lab 07/12/19 1552 07/13/19 0210  PROCALCITON 0.31 0.28    Recent Results (from the past 240 hour(s))  Culture, blood (routine x 2)     Status: None (Preliminary result)   Collection Time: 07/12/19  3:52 PM   Specimen: BLOOD  Result Value Ref Range Status   Specimen Description   Final    BLOOD RIGHT ARM Performed at [facility], [address], [place], [place]    Special Requests   Final    BOTTLES DRAWN AEROBIC AND ANAEROBIC Blood Culture adequate volume Performed at [facility], [address] [place], [place]    Culture   Final    NO GROWTH < 24 HOURS Performed at [facility], [address], [place], [address] [place]    Report Status PENDING  Incomplete  Culture, blood (routine x 2)     Status: None (Preliminary result)   Collection Time: 07/12/19  4:00 PM   Specimen: BLOOD  Result Value Ref Range Status  Specimen Description   Final    BLOOD RIGHT ARM Performed at [facility], [address] [place], [place] [address]    Special Requests   Final    BOTTLES DRAWN AEROBIC ONLY Blood Culture adequate volume Performed at [facility], [address] [place], [place] [address]    Culture   Final    NO GROWTH < 24 HOURS Performed at [facility], [address], [place], [place] [address]    Report Status PENDING  Incomplete         Radiology Studies: DG Chest Port 1 View  Result Date: 07/12/2019 CLINICAL DATA:  Shortness of breath. EXAM: PORTABLE CHEST 1 VIEW COMPARISON:  July 07, 2019. FINDINGS: Stable cardiomegaly. No pneumothorax or pleural effusion is noted. Interval development of diffuse airspace opacities are noted throughout both lungs most consistent with  multifocal pneumonia. Bony thorax is unremarkable. IMPRESSION: Interval development of diffuse airspace opacities throughout both lungs most consistent with multifocal pneumonia. Electronically Signed   By: Lupita Raider M.D.   On: 07/12/2019 15:45        Scheduled Meds: . amLODipine  5 mg Oral Daily  . clopidogrel  75 mg Oral Daily  . docusate sodium  200 mg Oral BID  . enoxaparin (LOVENOX) injection  55 mg Subcutaneous Q24H  . furosemide  80 mg Oral Daily  . influenza vac split quadrivalent PF  0.5 mL Intramuscular Tomorrow-1000  . insulin aspart  0-20 Units Subcutaneous TID WC  . insulin aspart  0-5 Units Subcutaneous QHS  . insulin glargine  10 Units Subcutaneous QHS  . metFORMIN  1,000 mg Oral Q supper  . methylPREDNISolone (SOLU-MEDROL) injection  60 mg Intravenous Q12H  . nebivolol  20 mg Oral Daily  . primidone  250 mg Oral Daily   Continuous Infusions: . remdesivir 100 mg in NS 100 mL Stopped (07/13/19 1319)     LOS: 1 day    Time spent: 35 minutes spent on chart review, discussion with nursing staff, consultants, updating family and interview/physical exam; more than 50% of that time was spent in counseling and/or coordination of care.    Alvira Philips [place], DO [facility] Available via Epic secure chat 7am-7pm After these hours, please refer to coverage provider listed on [email or web address] 07/13/2019, 1:26 PM

## 2019-07-13 NOTE — Progress Notes (Addendum)
Occupational Therapy Evaluation Patient Details Name: Frederick Gomez MRN: [id number] DOB: 1961-02-03 Today's Date: 07/13/2019    History of Present Illness Frederick Gomez is a 59 y.o. male with past medical history remarkable for obesity, OSA noncompliant with CPAP, hypertension, TIA, DM type II who was diagnosed with COVID-19 pneumonia about 8 days ago who presented [facility] with progressive shortness of breath and was found to be hypoxic with an SPO2 of 78% on room air. He was also noted to be tachypneic breathing between 35 and 50 breaths/min. He was admitted directly to [facility] for further care.    Clinical Impression   Pt was Modified Independent for ADLs, IADLs, and mobility using cane PTA. Pt was also working two jobs. Currently, pt on 15 L HFNC dropping to 85% while standing at sink using RW for ADLs after 2 minutes. Pt able to recover to 92% within 2 minutes on 15 L HFNC with instruction of pursed lip breathing and other energy conservation strategies. Pt also able to demonstrate proper use of flutter valve and incentive spirometer (up to 553mL) with minor cues for improved carryover.    Follow Up Recommendations  Home health OT    Equipment Recommendations  3 in 1 bedside commode    Recommendations for Other Services       Precautions / Restrictions Precautions Precautions: Fall Restrictions Weight Bearing Restrictions: No      Mobility Bed Mobility Overal bed mobility: Independent             General bed mobility comments: Pt in chair on OT entry  Transfers Overall transfer level: Modified independent Equipment used: Rolling walker (2 wheeled)                  Balance Overall balance assessment: Mild deficits observed, not formally tested                                         ADL either performed or assessed with clinical judgement   ADL Overall ADL's : Needs assistance/impaired Eating/Feeding: Independent   Grooming:  Set up;Wash/dry hands;Wash/dry face   Upper Body Bathing: Set up   Lower Body Bathing: Min guard   Upper Body Dressing : Set up   Lower Body Dressing: Min guard   Toilet Transfer: Min guard   Toileting- Clothing Manipulation and Hygiene: Min guard               Vision Baseline Vision/History: Wears glasses       Perception     Praxis      Pertinent Vitals/Pain Pain Assessment: No/denies pain     Hand Dominance Right   Extremity/Trunk Assessment Upper Extremity Assessment Upper Extremity Assessment: Generalized weakness(ROM WFL, minor shoulder strength deficits)   Lower Extremity Assessment Lower Extremity Assessment: Defer to PT evaluation       Communication Communication Communication: No difficulties   Cognition Arousal/Alertness: Awake/alert Behavior During Therapy: WFL for tasks assessed/performed Overall Cognitive Status: Within Functional Limits for tasks assessed                                     General Comments       Exercises Exercises: Other exercises Other Exercises Other Exercises: Instructed in use of flutter valve, incentive spirometer, pursed lip breathingf   Shoulder  Instructions      Home Living Family/patient expects to be discharged to:: Private residence Living Arrangements: Alone   Type of Home: House(mobile home) Home Access: Stairs to enter [employer] of Steps: 4 Entrance Stairs-Rails: Can reach both Home Layout: One level     Bathroom Shower/Tub: Chief Strategy Officer: Standard Bathroom Accessibility: Yes   Home Equipment: Cane - quad          Prior Functioning/Environment Level of Independence: Independent with assistive device(s)        Comments: Pt was Mod Independent with all ADLs/IADLs using cane. Pt was working two jobs and driving.         OT Problem List: Cardiopulmonary status limiting activity;Decreased activity tolerance;Decreased strength       OT Treatment/Interventions: Self-care/ADL training;Therapeutic exercise;Energy conservation;DME and/or AE instruction;Therapeutic activities;Patient/family education    OT Goals(Current goals can be found in the care plan section) Acute Rehab OT Goals Patient Stated Goal: go home OT Goal Formulation: With patient Time For Goal Achievement: 07/27/19 Potential to Achieve Goals: Good ADL Goals Pt Will Perform Lower Body Dressing: with modified independence;sit to/from stand Pt Will Transfer to Toilet: ambulating;with modified independence Pt Will Perform Toileting - Clothing Manipulation and hygiene: with modified independence;sit to/from stand Pt/caregiver will Perform Home Exercise Program: Increased strength;With theraband;Independently Additional ADL Goal #1: Pt will demonstrate a minimum of 3 energy conservation strategies Independently during ADLs/IADLs in order to maintain O2 stats above 90%  OT Frequency: Min 3X/week   Barriers to D/C: (lives alone)          Co-evaluation              AM-PAC OT "6 Clicks" Daily Activity     Outcome Measure Help from another person eating meals?: None Help from another person taking care of personal grooming?: A Little Help from another person toileting, which includes using toliet, bedpan, or urinal?: A Little Help from another person bathing (including washing, rinsing, drying)?: A Little Help from another person to put on and taking off regular upper body clothing?: A Little Help from another person to put on and taking off regular lower body clothing?: A Little 6 Click Score: 19   End of Session Equipment Utilized During Treatment: Gait belt;Rolling walker Nurse Communication: Other (comment);Mobility status(switched probes for better reading)  Activity Tolerance: Patient limited by fatigue;Patient tolerated treatment well Patient left: in chair;with call bell/phone within reach;with chair alarm set  OT Visit Diagnosis: Muscle  weakness (generalized) (M62.81)                Time: 1448-1856 OT Time Calculation (min): 31 min Charges:  OT Evaluation $OT Eval Moderate Complexity: 1 Mod OT Treatments $Self Care/Home Management : 8-22 mins  Lorre Munroe, OTR/L  Lorre Munroe 07/13/2019, 3:10 PM

## 2019-07-14 LAB — CBC WITH DIFFERENTIAL/PLATELET
Abs Immature Granulocytes: 0.2 10*3/uL — ABNORMAL HIGH (ref 0.00–0.07)
Basophils Absolute: 0 10*3/uL (ref 0.0–0.1)
Basophils Relative: 0 %
Eosinophils Absolute: 0 10*3/uL (ref 0.0–0.5)
Eosinophils Relative: 0 %
HCT: 41.5 % (ref 39.0–52.0)
Hemoglobin: 13.8 g/dL (ref 13.0–17.0)
Immature Granulocytes: 1 %
Lymphocytes Relative: 3 %
Lymphs Abs: 0.5 10*3/uL — ABNORMAL LOW (ref 0.7–4.0)
MCH: 28.2 pg (ref 26.0–34.0)
MCHC: 33.3 g/dL (ref 30.0–36.0)
MCV: 84.9 fL (ref 80.0–100.0)
Monocytes Absolute: 0.4 10*3/uL (ref 0.1–1.0)
Monocytes Relative: 2 %
Neutro Abs: 13.8 10*3/uL — ABNORMAL HIGH (ref 1.7–7.7)
Neutrophils Relative %: 94 %
Platelets: 316 10*3/uL (ref 150–400)
RBC: 4.89 MIL/uL (ref 4.22–5.81)
RDW: 12.6 % (ref 11.5–15.5)
WBC: 14.9 10*3/uL — ABNORMAL HIGH (ref 4.0–10.5)
nRBC: 0 % (ref 0.0–0.2)

## 2019-07-14 LAB — COMPREHENSIVE METABOLIC PANEL
ALT: 18 U/L (ref 0–44)
AST: 15 U/L (ref 15–41)
Albumin: 2.6 g/dL — ABNORMAL LOW (ref 3.5–5.0)
Alkaline Phosphatase: 83 U/L (ref 38–126)
Anion gap: 13 (ref 5–15)
BUN: 29 mg/dL — ABNORMAL HIGH (ref 6–20)
CO2: 26 mmol/L (ref 22–32)
Calcium: 8.5 mg/dL — ABNORMAL LOW (ref 8.9–10.3)
Chloride: 96 mmol/L — ABNORMAL LOW (ref 98–111)
Creatinine, Ser: 1.11 mg/dL (ref 0.61–1.24)
GFR calc Af Amer: >60 mL/min (ref >60)
GFR calc non Af Amer: >60 mL/min (ref >60)
Glucose, Bld: 260 mg/dL — ABNORMAL HIGH (ref 70–99)
Potassium: 3.8 mmol/L (ref 3.5–5.1)
Sodium: 135 mmol/L (ref 135–145)
Total Bilirubin: 0.4 mg/dL (ref 0.3–1.2)
Total Protein: 6.6 g/dL (ref 6.5–8.1)

## 2019-07-14 LAB — GLUCOSE, CAPILLARY
Glucose-Capillary: 269 mg/dL — ABNORMAL HIGH (ref 70–99)
Glucose-Capillary: 251 mg/dL — ABNORMAL HIGH (ref 70–99)
Glucose-Capillary: 353 mg/dL — ABNORMAL HIGH (ref 70–99)

## 2019-07-14 LAB — C-REACTIVE PROTEIN: CRP: 15.6 mg/dL — ABNORMAL HIGH (ref <1.0)

## 2019-07-14 LAB — BRAIN NATRIURETIC PEPTIDE: B Natriuretic Peptide: 115.8 pg/mL — ABNORMAL HIGH (ref 0.0–100.0)

## 2019-07-14 LAB — MAGNESIUM: Magnesium: 2.2 mg/dL (ref 1.7–2.4)

## 2019-07-14 LAB — D-DIMER, QUANTITATIVE: D-Dimer, Quant: 0.98 ug/mL-FEU — ABNORMAL HIGH (ref 0.00–0.50)

## 2019-07-14 LAB — PROCALCITONIN: Procalcitonin: 0.41 ng/mL

## 2019-07-14 MED ORDER — INSULIN GLARGINE 100 UNIT/ML ~~LOC~~ SOLN
15.0000 [IU] | Freq: Once | SUBCUTANEOUS | Status: AC
  Administered 2019-07-14: 08:00:00 15 [IU] via SUBCUTANEOUS
  Filled 2019-07-14: qty 0.15

## 2019-07-14 MED ORDER — INSULIN GLARGINE 100 UNIT/ML ~~LOC~~ SOLN
25.0000 [IU] | Freq: Every day | SUBCUTANEOUS | Status: DC
  Administered 2019-07-14 – 2019-07-15 (×2): 25 [IU] via SUBCUTANEOUS
  Filled 2019-07-14 (×2): qty 0.25

## 2019-07-14 MED ORDER — INSULIN ASPART 100 UNIT/ML ~~LOC~~ SOLN
5.0000 [IU] | Freq: Three times a day (TID) | SUBCUTANEOUS | Status: DC
  Administered 2019-07-14 – 2019-07-19 (×15): 5 [IU] via SUBCUTANEOUS

## 2019-07-14 NOTE — Progress Notes (Signed)
Physical Therapy Evaluation Patient Details Name: Frederick Gomez MRN: [id number] DOB: 04-26-1961 Today's Date: 07/14/2019   History of Present Illness  Frederick Gomez is a 58 y.o. male with past medical history remarkable for obesity, OSA noncompliant with CPAP, hypertension, TIA, DM type II who was diagnosed with COVID-19 pneumonia about 8 days ago (~07/04/19). He was admitted to [facility] on 07/12/23 due to severe acute hypoxic resp failure with resting SPO2 of 78% on room air prior to admission.  Clinical Impression  Pt demonstrates high motivation for participation with therapy. Pt was on 15 L/M HFNC upon entry sitting comfortably in recliner. Pt was AOx3, demonstrated no anxiety with mobility with O2 sat dropping to 88% following 61ft ambulation with RW and CGAx1. Pt demonstrated ability to ambulate 62ft on 10L/M HFNC with O2 to 82% with <5 min recovery. Overall demonstrates good functional mobility, good awareness of limitations, and good response to education.  Tested patient's  O2 levels with [employer] forehead monitor, pediatric finger, adult finger, and in place ear lobe monitor. Patient's ear tested 2-3 % above gold standard forehead monitor, but others tested 3-4 % below. Left patient with ear monitor on due to minimal distress with any activity and reduced oxygen today.  Pt will continue to benefit from weaning O2 as able. Due to limited home support patient can benefit from skilled PT services to improve functional mobility, safety, energy conservation, DME education, and HEP progression.     Follow Up Recommendations Home health PT    Equipment Recommendations  3in1 (PT)    Recommendations for Other Services       Precautions / Restrictions Precautions Precautions: Fall Restrictions Weight Bearing Restrictions: No      Mobility  Bed Mobility Overal bed mobility: Independent             General bed mobility comments: Pt in chair for PT entry  Transfers Overall transfer level:  Modified independent Equipment used: Rolling walker (2 wheeled)             General transfer comment: Pt requires cueing for sequencing and safety  Ambulation/Gait Ambulation/Gait assistance: Min guard;Supervision Gait Distance (Feet): 40 Feet(44ft (15 L/M HFNC) 29ft (10L/M HFNC)) Assistive device: Rolling walker (2 wheeled) Gait Pattern/deviations: Step-through pattern;Shuffle;Staggering left;Wide base of support   Gait velocity interpretation: <1.8 ft/sec, indicate of risk for recurrent falls    Stairs            Wheelchair Mobility    Modified Rankin (Stroke Patients Only)       Balance Overall balance assessment: Mild deficits observed, not formally tested                                           Pertinent Vitals/Pain Pain Assessment: No/denies pain    Home Living Family/patient expects to be discharged to:: Private residence Living Arrangements: Alone Available Help at Discharge: Home health;Other (Comment)(Pt states has home health & social worker) Type of Home: House Home Access: Stairs to enter Entrance Stairs-Rails: Can reach both Entrance Stairs-Number of Steps: 4 Home Layout: One level [place]: [place] - 2 wheels      Prior Function Level of Independence: Independent with assistive device(s)         Comments: Pt was Mod Independent with all ADLs/IADLs using cane. Pt was working two jobs and driving.      Hand Dominance  Dominant Hand: Right    Extremity/Trunk Assessment   Upper Extremity Assessment Upper Extremity Assessment: Defer to OT evaluation    Lower Extremity Assessment Lower Extremity Assessment: Generalized weakness(Pt demonstrates reduced full hip mobility (flexion/ER))       Communication   Communication: No difficulties  Cognition Arousal/Alertness: Awake/alert Behavior During Therapy: WFL for tasks assessed/performed Overall Cognitive Status: Within Functional Limits for  tasks assessed                                 General Comments: Pt was very motivated to work with therapy, loves the sputter valve, observed using multiple times in session without prompting.      General Comments General comments (skin integrity, edema, etc.): 15 L/min HFNC 95% sat at rest. Pt demonstrates no skin integrity issues, able to ambulate at 10L/m for 30ft desat to 82%, recovery to 88% within a couple min rest break, no anxiety or difficulty noted in recover or with ambulation. Ambulated with 15L/m with desat to 86% 40ft.  5x sit to stand test: 52 seconds for completion demonstrating compromised endurance for norms.    Exercises General Exercises - Lower Extremity Ankle Circles/Pumps: AROM;Both;15 reps Hip Flexion/Marching: AROM;15 reps Other Exercises Other Exercises: Pt teach back in use of flutter valve, incentive spirometer, pursed lip breathing. Explination for rationale behind pursed lip breathing and flutter valve with pt verbalizing understanding, but <50% teach back today at end of session.    Assessment/Plan    PT Assessment Patient needs continued PT services  PT Problem List Decreased strength;Decreased range of motion;Decreased activity tolerance;Decreased mobility;Decreased coordination;Decreased balance;Decreased knowledge of precautions;Decreased safety awareness;Cardiopulmonary status limiting activity       PT Treatment Interventions DME instruction;Gait training;Stair training;Functional mobility training;Therapeutic activities;Therapeutic exercise;Balance training;Neuromuscular re-education;Patient/family education;Manual techniques;Modalities    PT Goals (Current goals can be found in the Care Plan section)  Acute Rehab PT Goals Patient Stated Goal: go home PT Goal Formulation: With patient Time For Goal Achievement: 07/22/19 Potential to Achieve Goals: Good    Frequency Min 3X/week   Barriers to discharge (Steps to enter home, no  access to 24/7 support initially)      Co-evaluation               AM-PAC PT "6 Clicks" Mobility  Outcome Measure Help needed turning from your back to your side while in a flat bed without using bedrails?: None Help needed moving from lying on your back to sitting on the side of a flat bed without using bedrails?: A Little Help needed moving to and from a bed to a chair (including a wheelchair)?: A Little Help needed standing up from a chair using your arms (e.g., wheelchair or bedside chair)?: A Little Help needed to walk in hospital room?: A Little Help needed climbing 3-5 steps with a railing? : A Lot 6 Click Score: 18    End of Session Equipment Utilized During Treatment: Gait belt;Oxygen Activity Tolerance: Patient limited by fatigue(Pt desated to 82 during ambulation.) Patient left: in chair;with call bell/phone within reach;with chair alarm set Nurse Communication: Mobility status PT Visit Diagnosis: Unsteadiness on feet (R26.81);Muscle weakness (generalized) (M62.81)    Time: 6270-3500 PT Time Calculation (min) (ACUTE ONLY): 43 min   Charges:   PT Evaluation $PT Eval Moderate Complexity: 1 Mod PT Treatments $Gait Training: 8-22 mins $Therapeutic Exercise: 8-22 mins        Oda Cogan PT, DPT Acute Rehab  [facility] P: [phone]   Newman Nickels 07/14/2019, 2:17 PM

## 2019-07-14 NOTE — Progress Notes (Signed)
Pt resting in bed, all night oxygen levels have been low to mid 90s, when checking vital signs, pt oxygen level was in the 80s, encouraged pt to cough and deep breath, use flutter valve, incentive spirometer, and lay on side (not able to prone due to having a large stomach), despite all interventions, oxygen levels remained in the 80s on 15L high flow nasal cannula, to help with oxygen saturation, placed pt on non-rebreather; currently pt is on high flow nasal cannula and non-rebreather

## 2019-07-14 NOTE — Progress Notes (Signed)
PROGRESS NOTE    Frederick Gomez  SWF:[id number] DOB: 07-May-1961 DOA: 07/12/2019 PCP: Frederick Able, FNP    Brief Narrative:   Frederick Gomez is a 58 y.o. male with past medical history remarkable for obesity, OSA noncompliant with CPAP, hypertension, TIA, DM type II who was diagnosed with COVID-19 pneumonia about 8 days ago who presented from the [facility] infusion clinic with progressive shortness of breath and was found to be hypoxic with an SPO2 of 78% on room air. He was also noted to be tachypneic breathing between 35 and 50 breaths/min. He was admitted directly to [facility] for further care.    Assessment & Plan:   Active Problems:   COVID-19 virus infection  Acute hypoxic respiratory failure secondary to acute Covid-19 viral pneumonia during the ongoing 2020 Covid 19 Pandemic - POA Patient presenting from [facility] infusion clinic with tachypnea and was found to be hypoxic oxygenating 78% on room air. Chest x-ray with interval development of diffuse airspace opacities throughout both lungs consistent with multifocal pneumonia. --ddimer 1.52-->1.16-->0.98 --CRP 25.2-->26.6-->15.6 --s/p Actemra on 07/12/2019 and 07/13/2019 --Continue remdesivir, plan 5-day course; day #3/5 --Continue Solu-Medrol 60 mg IV every 12 hours --Continue supplemental oxygen, titrate to maintain SPO2 greater than 92%; currently on 15 L NRB w/ SPO2 97% --Continue supportive care with vitamin C, zinc, Tylenol --Follow CBC, CMP, D-dimer, ferritin, and CRP daily --Continue airborne/contact isolation precautions --check CXR in am  Morbid obesity Obstructive sleep apnea Body mass index is 35.84 kg/m. Discussed with patient needs for aggressive weight loss measures and lifestyle changes as this complicates all facets of care. Also noncompliant with his home CPAP, counseled on need for use. --Continue supplemental oxygen while inpatient, currently on 15 L NRB  Essential hypertension: BP 124/77, well controlled. --Continue  amlodipine 5 mg p.o. daily, furosemide 80 mg p.o. daily, Bystolic 20 mg p.o. daily  Dyslipidemia: Currently not on statin due to allergy, follow-up with PCP.  History of TIA: Continue Plavix; not on statin due to allergy.  Type 2 diabetes mellitus A1c 8.6, not optimally controlled. On Jardiance and Metformin outpatient. --Continue Metformin 1000 mg p.o. daily --Increase Lantus to 25u [place] daily given elevated blood sugars in the setting of steroid use --Continue insulin sliding scale for coverage and will continue to monitor for further adjustments   DVT prophylaxis: Lovenox Code Status: Full code Family Communication: Updated patient extensively at bedside Disposition Plan: From home, OT recommends home health, awaiting PT evaluation; currently unsafe discharge at this time given continued high FiO2 requirements   Consultants:   None  Procedures:   None  Antimicrobials:   None   Subjective:  Patient seen and examined bedside, resting comfortably. Continues with significant dyspnea especially with any type of exertion. Also reports nonproductive cough.  Currently on 15 L high flow nasal cannula. No other specific complaints this morning. Denies fever, no headache, no chills/night sweats, no nausea/vomiting/diarrhea, no chest pain, no palpitations, no abdominal pain. No other acute events overnight per nursing staff.  Objective: Vitals:   07/14/19 0515 07/14/19 0532 07/14/19 0706 07/14/19 0718  BP:    117/71  Pulse:    62  Resp:   19 (!) 21  Temp:    97.9 F (36.6 C)  TempSrc:    Oral  SpO2: (!) 88% 94% 95% 90%  Weight:      Height:        Intake/Output Summary (Last 24 hours) at 07/14/2019 1039 Last data filed at 07/14/2019 0300 Gross per 24  hour  Intake 1400 ml  Output 950 ml  Net 450 ml   Filed Weights   07/12/19 1525  Weight: 108.5 kg    Examination:  General exam: Appears calm and comfortable  Respiratory system: Clear to auscultation. Slight increased  respiratory effort, no accessory muscle use, on 15 L HFNC oxygenating 97% Cardiovascular system: S1 & S2 heard, RRR. No JVD, murmurs, rubs, gallops or clicks. No pedal edema. Gastrointestinal system: Abdomen is nondistended, soft and nontender. No organomegaly or masses felt. Normal bowel sounds heard. Central nervous system: Alert and oriented. No focal neurological deficits. Extremities: Symmetric 5 x 5 power. Skin: No rashes, lesions or ulcers Psychiatry: Judgement and insight appear normal. Mood & affect appropriate.     Data Reviewed: I have personally reviewed following labs and imaging studies  CBC: Recent Labs  Lab 07/12/19 1552 07/13/19 0210 07/14/19 0230  WBC 8.4 6.0 14.9*  NEUTROABS  --  5.8 13.8*  HGB 14.9 14.7 13.8  HCT 44.6 43.8 41.5  MCV 84.0 85.4 84.9  PLT 263 267 316   Basic Metabolic Panel: Recent Labs  Lab 07/12/19 1552 07/12/19 1717 07/13/19 0210 07/14/19 0230  NA 133* 136 137 135  K 3.1* 3.3* 4.4 3.8  CL 94* 96* 98 96*  CO2 27 25 27 26   GLUCOSE 125* 120* 252* 260*  BUN 12 12 20  29*  CREATININE 1.13 1.10 0.98 1.11  CALCIUM 8.3* 8.3* 8.6* 8.5*  MG 2.0  --  2.4 2.2   GFR: Estimated Creatinine Clearance: 89.5 mL/min (by C-G formula based on SCr of 1.11 mg/dL). Liver Function Tests: Recent Labs  Lab 07/12/19 1717 07/13/19 0210 07/14/19 0230  AST 31 20 15   ALT 20 18 18   ALKPHOS 85 82 83  BILITOT 1.1 0.6 0.4  PROT 7.0 7.0 6.6  ALBUMIN 3.0* 2.8* 2.6*   No results for input(s): LIPASE, AMYLASE in the last 168 hours. No results for input(s): AMMONIA in the last 168 hours. Coagulation Profile: No results for input(s): INR, PROTIME in the last 168 hours. Cardiac Enzymes: No results for input(s): CKTOTAL, CKMB, CKMBINDEX, TROPONINI in the last 168 hours. BNP (last 3 results) No results for input(s): PROBNP in the last 8760 hours. HbA1C: Recent Labs    07/12/19 1552  HGBA1C 8.6*   CBG: Recent Labs  Lab 07/13/19 0740 07/13/19 1151  07/13/19 1601 07/13/19 2139 07/14/19 0720  GLUCAP 202* 338* 399* 269* 251*   Lipid Profile: No results for input(s): CHOL, HDL, LDLCALC, TRIG, CHOLHDL, LDLDIRECT in the last 72 hours. Thyroid Function Tests: No results for input(s): TSH, T4TOTAL, FREET4, T3FREE, THYROIDAB in the last 72 hours. Anemia Panel: No results for input(s): VITAMINB12, FOLATE, FERRITIN, TIBC, IRON, RETICCTPCT in the last 72 hours. Sepsis Labs: Recent Labs  Lab 07/12/19 1552 07/13/19 0210 07/14/19 0230  PROCALCITON 0.31 0.28 0.41    Recent Results (from the past 240 hour(s))  Culture, blood (routine x 2)     Status: None (Preliminary result)   Collection Time: 07/12/19  3:52 PM   Specimen: BLOOD  Result Value Ref Range Status   Specimen Description   Final    BLOOD RIGHT ARM Performed at [facility], [address], [place], [place]    Special Requests   Final    BOTTLES DRAWN AEROBIC AND ANAEROBIC Blood Culture adequate volume Performed at [facility], [address] [place], [place]    Culture   Final    NO GROWTH <  24 HOURS Performed at [facility], [address] [place], [place] [address]    Report Status PENDING  Incomplete  Culture, blood (routine x 2)     Status: None (Preliminary result)   Collection Time: 07/12/19  4:00 PM   Specimen: BLOOD  Result Value Ref Range Status   Specimen Description   Final    BLOOD RIGHT ARM Performed at [facility] [place], [address], [place], [place] [address]    Special Requests   Final    BOTTLES DRAWN AEROBIC ONLY Blood Culture adequate volume Performed at [facility], [address], [place], [place] [address]    Culture   Final    NO GROWTH < 24 HOURS Performed at [facility], [address] [place], [place] [address]    Report Status PENDING  Incomplete         Radiology Studies: DG Chest Port 1 View  Result Date:  07/12/2019 CLINICAL DATA:  Shortness of breath. EXAM: PORTABLE CHEST 1 VIEW COMPARISON:  July 07, 2019. FINDINGS: Stable cardiomegaly. No pneumothorax or pleural effusion is noted. Interval development of diffuse airspace opacities are noted throughout both lungs most consistent with multifocal pneumonia. Bony thorax is unremarkable. IMPRESSION: Interval development of diffuse airspace opacities throughout both lungs most consistent with multifocal pneumonia. Electronically Signed   By: Lupita Raider M.D.   On: 07/12/2019 15:45        Scheduled Meds: . amLODipine  5 mg Oral Daily  . clopidogrel  75 mg Oral Daily  . docusate sodium  200 mg Oral BID  . enoxaparin (LOVENOX) injection  55 mg Subcutaneous Q24H  . furosemide  80 mg Oral Daily  . influenza vac split quadrivalent PF  0.5 mL Intramuscular Tomorrow-1000  . insulin aspart  0-20 Units Subcutaneous TID WC  . insulin aspart  0-5 Units Subcutaneous QHS  . insulin glargine  25 Units Subcutaneous QHS  . metFORMIN  1,000 mg Oral Q supper  . methylPREDNISolone (SOLU-MEDROL) injection  60 mg Intravenous Q12H  . nebivolol  20 mg Oral Daily  . primidone  250 mg Oral Daily   Continuous Infusions: . remdesivir 100 mg in NS 100 mL 100 mg (07/14/19 1036)     LOS: 2 days    Time spent: 35 minutes spent on chart review, discussion with nursing staff, consultants, updating family and interview/physical exam; more than 50% of that time was spent in counseling and/or coordination of care.    Alvira Philips [place], DO [facility] Available via Epic secure chat 7am-7pm After these hours, please refer to coverage provider listed on [email or web address] 07/14/2019, 10:39 AM

## 2019-07-14 NOTE — Progress Notes (Signed)
Results for KARRIEM, MUENCH (MRN [id number]) as of 07/14/2019 13:19  Ref. Range 07/13/2019 07:40 07/13/2019 11:51 07/13/2019 16:01 07/13/2019 21:39 07/14/2019 07:20  Glucose-Capillary Latest Ref Range: 70 - 99 mg/dL 909 (H) 311 (H) 216 (H) 269 (H) 251 (H)  Noted that patient's CBGs have been greater than 200 mg/dl.  Recommend adding Novolog 5 units TID with meals if patient eats at least 50% of meal and if blood sugars continue to be elevated.   Smith Mince RN BSN CDE Diabetes Coordinator Pager: [phone]  8am-5pm

## 2019-07-15 ENCOUNTER — Inpatient Hospital Stay (HOSPITAL_COMMUNITY): Payer: BC Managed Care – PPO

## 2019-07-15 LAB — CBC WITH DIFFERENTIAL/PLATELET
Abs Immature Granulocytes: 0.11 10*3/uL — ABNORMAL HIGH (ref 0.00–0.07)
Basophils Absolute: 0 10*3/uL (ref 0.0–0.1)
Basophils Relative: 0 %
Eosinophils Absolute: 0 10*3/uL (ref 0.0–0.5)
Eosinophils Relative: 0 %
HCT: 41.3 % (ref 39.0–52.0)
Hemoglobin: 13.9 g/dL (ref 13.0–17.0)
Immature Granulocytes: 1 %
Lymphocytes Relative: 3 %
Lymphs Abs: 0.4 10*3/uL — ABNORMAL LOW (ref 0.7–4.0)
MCH: 28.5 pg (ref 26.0–34.0)
MCHC: 33.7 g/dL (ref 30.0–36.0)
MCV: 84.8 fL (ref 80.0–100.0)
Monocytes Absolute: 0.4 10*3/uL (ref 0.1–1.0)
Monocytes Relative: 3 %
Neutro Abs: 11.9 10*3/uL — ABNORMAL HIGH (ref 1.7–7.7)
Neutrophils Relative %: 93 %
Platelets: 333 10*3/uL (ref 150–400)
RBC: 4.87 MIL/uL (ref 4.22–5.81)
RDW: 12.5 % (ref 11.5–15.5)
WBC: 12.9 10*3/uL — ABNORMAL HIGH (ref 4.0–10.5)
nRBC: 0 % (ref 0.0–0.2)

## 2019-07-15 LAB — COMPREHENSIVE METABOLIC PANEL
ALT: 22 U/L (ref 0–44)
AST: 22 U/L (ref 15–41)
Albumin: 2.5 g/dL — ABNORMAL LOW (ref 3.5–5.0)
Alkaline Phosphatase: 82 U/L (ref 38–126)
Anion gap: 10 (ref 5–15)
BUN: 31 mg/dL — ABNORMAL HIGH (ref 6–20)
CO2: 28 mmol/L (ref 22–32)
Calcium: 8.2 mg/dL — ABNORMAL LOW (ref 8.9–10.3)
Chloride: 98 mmol/L (ref 98–111)
Creatinine, Ser: 1.02 mg/dL (ref 0.61–1.24)
GFR calc Af Amer: >60 mL/min (ref >60)
GFR calc non Af Amer: >60 mL/min (ref >60)
Glucose, Bld: 164 mg/dL — ABNORMAL HIGH (ref 70–99)
Potassium: 3.6 mmol/L (ref 3.5–5.1)
Sodium: 136 mmol/L (ref 135–145)
Total Bilirubin: 0.5 mg/dL (ref 0.3–1.2)
Total Protein: 6.3 g/dL — ABNORMAL LOW (ref 6.5–8.1)

## 2019-07-15 LAB — D-DIMER, QUANTITATIVE: D-Dimer, Quant: 0.89 ug/mL-FEU — ABNORMAL HIGH (ref 0.00–0.50)

## 2019-07-15 LAB — MAGNESIUM: Magnesium: 2.3 mg/dL (ref 1.7–2.4)

## 2019-07-15 LAB — GLUCOSE, CAPILLARY
Glucose-Capillary: 168 mg/dL — ABNORMAL HIGH (ref 70–99)
Glucose-Capillary: 176 mg/dL — ABNORMAL HIGH (ref 70–99)
Glucose-Capillary: 219 mg/dL — ABNORMAL HIGH (ref 70–99)
Glucose-Capillary: 275 mg/dL — ABNORMAL HIGH (ref 70–99)

## 2019-07-15 LAB — C-REACTIVE PROTEIN: CRP: 8.2 mg/dL — ABNORMAL HIGH (ref <1.0)

## 2019-07-15 LAB — BRAIN NATRIURETIC PEPTIDE: B Natriuretic Peptide: 131.7 pg/mL — ABNORMAL HIGH (ref 0.0–100.0)

## 2019-07-15 MED ORDER — FUROSEMIDE 10 MG/ML IJ SOLN
40.0000 mg | Freq: Two times a day (BID) | INTRAMUSCULAR | Status: DC
  Administered 2019-07-15 – 2019-07-17 (×6): 40 mg via INTRAVENOUS
  Filled 2019-07-15 (×7): qty 4

## 2019-07-15 MED ORDER — POLYETHYLENE GLYCOL 3350 17 G PO PACK
17.0000 g | PACK | Freq: Once | ORAL | Status: AC
  Administered 2019-07-15: 18:00:00 17 g via ORAL
  Filled 2019-07-15: qty 1

## 2019-07-15 MED ORDER — MELATONIN 5 MG PO TABS
5.0000 mg | ORAL_TABLET | Freq: Every evening | ORAL | Status: DC | PRN
  Administered 2019-07-15 – 2019-07-18 (×4): 5 mg via ORAL
  Filled 2019-07-15 (×5): qty 1

## 2019-07-15 NOTE — Progress Notes (Signed)
Physical Therapy Treatment Patient Details Name: Frederick Gomez MRN: [id number] DOB: 24-Sep-1960 Today's Date: 07/15/2019    History of Present Illness Frederick Gomez is a 59 y.o. male with past medical history remarkable for obesity, OSA noncompliant with CPAP, hypertension, TIA, DM type II who was diagnosed with COVID-19 pneumonia about 8 days ago (~07/04/19). He was admitted to [facility] on 07/12/23 due to severe acute hypoxic resp failure with resting SPO2 of 78% on room air prior to admission.    PT Comments    Pt has made great progress towards PT goals. He was able to ambulate 90 feet in room with sats remaining above 85% on 8L O2 HFNC. He experienced no anxiety or difficulty catching his breath but required 3 intermittent standing rest breaks due to RR reaching mid 30s. His HR reacted normally to activity increasing from 60s to 70s, and reducing at rest. He was able to ambulate 125 ft in hall with O2 at 6L/m requiring 1 standing rest break with O2 sat staying above 82%. When patient returned to sitting position O2 sats returned to low 90s. RN notified of O2 remaining at 6 L due to progress made with ambulation. Pt can continue to benefit from skilled PT services to address generalized weakness and fatigue to improved endurance for ADLs and home mobility.    Follow Up Recommendations  No PT follow up(Due to progress depending on O2 level may need no HH PT)     Equipment Recommendations  3in1 (PT)    Recommendations for Other Services       Precautions / Restrictions Precautions Precautions: Fall;Other (comment)(Airborne) Restrictions Weight Bearing Restrictions: No    Mobility  Bed Mobility               General bed mobility comments: Pt in chair for PT entry  Transfers Overall transfer level: Needs assistance(for line management only) Equipment used: Rolling walker (2 wheeled) Transfers: Sit to/from Stand Sit to Stand: Supervision(for line management) Stand pivot transfers:  Supervision(line management)       General transfer comment: Pt continues to require intermittent cues for sequencing and safety  Ambulation/Gait Ambulation/Gait assistance: Supervision Gait Distance (Feet): 215 Feet(90 ft on 8 L/m HFNC, 125 on 6 L/min HFNC) Assistive device: Standard walker Gait Pattern/deviations: Step-through pattern;Wide base of support(LLE ER at hip, could/did not correct with cues to address)   Gait velocity interpretation: <1.8 ft/sec, indicate of risk for recurrent falls     Stairs             Wheelchair Mobility    Modified Rankin (Stroke Patients Only)       Balance Overall balance assessment: No apparent balance deficits (not formally assessed)                                          Cognition Arousal/Alertness: Awake/alert Behavior During Therapy: WFL for tasks assessed/performed Overall Cognitive Status: Within Functional Limits for tasks assessed                                 General Comments: Pt motivated to participate with therapy. Pt OOB on 7L HFNC upon entry. Vitals stable.      Exercises Other Exercises Other Exercises: incentive spirometry & flutter valve x 10 ea prior to ambulation    General Comments General comments (skin integrity,  edema, etc.): Pt on 7L HFNC upon approach OOB. Pt demonstrates high motivation to participate with therapy, encourage to continue working with staff frequently to improve endurance.      Pertinent Vitals/Pain Pain Assessment: No/denies pain    Home Living                      Prior Function            PT Goals (current goals can now be found in the care plan section) Acute Rehab PT Goals Patient Stated Goal: go home PT Goal Formulation: With patient Time For Goal Achievement: 07/22/19 Potential to Achieve Goals: Good Progress towards PT goals: Progressing toward goals    Frequency    Min 3X/week      PT Plan Current plan  remains appropriate    Co-evaluation              AM-PAC PT "6 Clicks" Mobility   Outcome Measure  Help needed turning from your back to your side while in a flat bed without using bedrails?: None Help needed moving from lying on your back to sitting on the side of a flat bed without using bedrails?: A Little Help needed moving to and from a bed to a chair (including a wheelchair)?: None Help needed standing up from a chair using your arms (e.g., wheelchair or bedside chair)?: None Help needed to walk in hospital room?: A Little Help needed climbing 3-5 steps with a railing? : A Little 6 Click Score: 21    End of Session Equipment Utilized During Treatment: Oxygen Activity Tolerance: Patient tolerated treatment well;Patient limited by fatigue Patient left: in bed;with call bell/phone within reach Nurse Communication: Mobility status PT Visit Diagnosis: Unsteadiness on feet (R26.81);Muscle weakness (generalized) (M62.81)     Time: 3149-7026 PT Time Calculation (min) (ACUTE ONLY): 29 min  Charges:  $Gait Training: 23-37 mins                     Ann Held PT, DPT [place] P: [place] 07/15/2019, 4:54 PM

## 2019-07-15 NOTE — Progress Notes (Signed)
PROGRESS NOTE    Frederick Gomez  DVV:[id number] DOB: 25-Dec-1960 DOA: 07/12/2019 PCP: Leilani Able, FNP    Brief Narrative:   Frederick Gomez is a 59 y.o. male with past medical history remarkable for obesity, OSA noncompliant with CPAP, hypertension, TIA, DM type II who was diagnosed with COVID-19 pneumonia about 8 days ago who presented from the [facility] infusion clinic with progressive shortness of breath and was found to be hypoxic with an SPO2 of 78% on room air. He was also noted to be tachypneic breathing between 35 and 50 breaths/min. He was admitted directly to [facility] for further care.    Assessment & Plan:   Active Problems:   COVID-19 virus infection  Acute hypoxic respiratory failure secondary to acute Covid-19 viral pneumonia during the ongoing 2020 Covid 19 Pandemic - POA Patient presenting from [facility] infusion clinic with tachypnea and was found to be hypoxic oxygenating 78% on room air. Chest x-ray with interval development of diffuse airspace opacities throughout both lungs consistent with multifocal pneumonia. --ddimer 1.52-->1.16-->0.98-->0.89 --CRP 25.2-->26.6-->15.6-->8.2 --s/p Actemra on 07/12/2019 and 07/13/2019 --Continue remdesivir, plan 5-day course; day #4/5 --Continue Solu-Medrol 60 mg IV every 12 hours --Furosemide 40 mg IV BID --Continue supplemental oxygen, titrate to maintain SPO2 greater than 92%; currently on 8 L HFNC w/ SPO2 95% --Continue supportive care with vitamin C, zinc, Tylenol --Follow CBC, CMP, D-dimer, ferritin, and CRP daily --Continue airborne/contact isolation precautions  Morbid obesity Obstructive sleep apnea Body mass index is 35.84 kg/m. Discussed with patient needs for aggressive weight loss measures and lifestyle changes as this complicates all facets of care. Also noncompliant with his home CPAP, counseled on need for use. --Continue supplemental oxygen while inpatient, currently on 8 L HFNC  Essential hypertension: BP 124/77, well  controlled. --Continue amlodipine 5 mg p.o. daily and Bystolic 20 mg p.o. daily --holding home furosemide 80 mg p.o. in favor of 40mg  IV BID  Dyslipidemia: Currently not on statin due to allergy, follow-up with PCP.  History of TIA: Continue Plavix; not on statin due to allergy.  Type 2 diabetes mellitus A1c 8.6, not optimally controlled. On Jardiance and Metformin outpatient. --Continue Metformin 1000 mg p.o. daily --Continue Lantus 25u  daily and Novolog 5u TIDAC given elevated blood sugars in the setting of steroid use --Continue insulin sliding scale for coverage and will continue to monitor for further adjustments   DVT prophylaxis: Lovenox Code Status: Full code Family Communication: Updated patient extensively at bedside Disposition Plan: From home, OT recommends home health, awaiting PT evaluation; currently unsafe discharge at this time given continued high FiO2 requirements   Consultants:   None  Procedures:   None  Antimicrobials:   None   Subjective:  Patient seen and examined bedside, resting comfortably in bedside chair eating breakfast. Continues with significant dyspnea especially with any type of exertion; but reports small improvement each day. Currently on 8 L high flow nasal cannula, which is improved since yesterday. No other specific complaints this morning. Denies fever, no headache, no chills/night sweats, no nausea/vomiting/diarrhea, no chest pain, no palpitations, no abdominal pain. No other acute events overnight per nursing staff.  Objective: Vitals:   07/15/19 0040 07/15/19 0612 07/15/19 0807 07/15/19 0813  BP: 105/60  123/72 123/72  Pulse: 60     Resp: (!) 21  (!) 22   Temp: 98.8 F (37.1 C) 97.6 F (36.4 C) 97.8 F (36.6 C)   TempSrc: Oral Oral Oral   SpO2: 95%  92%   Weight:  Height:        Intake/Output Summary (Last 24 hours) at 07/15/2019 1128 Last data filed at 07/15/2019 1025 Gross per 24 hour  Intake 1060 ml  Output  1950 ml  Net -890 ml   Filed Weights   07/12/19 1525  Weight: 108.5 kg    Examination:  General exam: Appears calm and comfortable  Respiratory system: Clear to auscultation. Slight increased respiratory effort, no accessory muscle use, on 8 L HFNC oxygenating 95% Cardiovascular system: S1 & S2 heard, RRR. No JVD, murmurs, rubs, gallops or clicks. No pedal edema. Gastrointestinal system: Abdomen is nondistended, soft and nontender. No organomegaly or masses felt. Normal bowel sounds heard. Central nervous system: Alert and oriented. No focal neurological deficits. Extremities: Symmetric 5 x 5 power. Skin: No rashes, lesions or ulcers Psychiatry: Judgement and insight appear normal. Mood & affect appropriate.     Data Reviewed: I have personally reviewed following labs and imaging studies  CBC: Recent Labs  Lab 07/12/19 1552 07/13/19 0210 07/14/19 0230 07/15/19 0132  WBC 8.4 6.0 14.9* 12.9*  NEUTROABS  --  5.8 13.8* 11.9*  HGB 14.9 14.7 13.8 13.9  HCT 44.6 43.8 41.5 41.3  MCV 84.0 85.4 84.9 84.8  PLT 263 267 316 333   Basic Metabolic Panel: Recent Labs  Lab 07/12/19 1552 07/12/19 1717 07/13/19 0210 07/14/19 0230 07/15/19 0132  NA 133* 136 137 135 136  K 3.1* 3.3* 4.4 3.8 3.6  CL 94* 96* 98 96* 98  CO2 27 25 27 26 28   GLUCOSE 125* 120* 252* 260* 164*  BUN 12 12 20  29* 31*  CREATININE 1.13 1.10 0.98 1.11 1.02  CALCIUM 8.3* 8.3* 8.6* 8.5* 8.2*  MG 2.0  --  2.4 2.2 2.3   GFR: Estimated Creatinine Clearance: 97.5 mL/min (by C-G formula based on SCr of 1.02 mg/dL). Liver Function Tests: Recent Labs  Lab 07/12/19 1717 07/13/19 0210 07/14/19 0230 07/15/19 0132  AST 31 20 15 22   ALT 20 18 18 22   ALKPHOS 85 82 83 82  BILITOT 1.1 0.6 0.4 0.5  PROT 7.0 7.0 6.6 6.3*  ALBUMIN 3.0* 2.8* 2.6* 2.5*   No results for input(s): LIPASE, AMYLASE in the last 168 hours. No results for input(s): AMMONIA in the last 168 hours. Coagulation Profile: No results for  input(s): INR, PROTIME in the last 168 hours. Cardiac Enzymes: No results for input(s): CKTOTAL, CKMB, CKMBINDEX, TROPONINI in the last 168 hours. BNP (last 3 results) No results for input(s): PROBNP in the last 8760 hours. HbA1C: Recent Labs    07/12/19 1552  HGBA1C 8.6*   CBG: Recent Labs  Lab 07/14/19 1220 07/14/19 1642 07/14/19 2032 07/15/19 0039 07/15/19 0727  GLUCAP 353* 275* 219* 176* 168*   Lipid Profile: No results for input(s): CHOL, HDL, LDLCALC, TRIG, CHOLHDL, LDLDIRECT in the last 72 hours. Thyroid Function Tests: No results for input(s): TSH, T4TOTAL, FREET4, T3FREE, THYROIDAB in the last 72 hours. Anemia Panel: No results for input(s): VITAMINB12, FOLATE, FERRITIN, TIBC, IRON, RETICCTPCT in the last 72 hours. Sepsis Labs: Recent Labs  Lab 07/12/19 1552 07/13/19 0210 07/14/19 0230  PROCALCITON 0.31 0.28 0.41    Recent Results (from the past 240 hour(s))  Culture, blood (routine x 2)     Status: None (Preliminary result)   Collection Time: 07/12/19  3:52 PM   Specimen: BLOOD  Result Value Ref Range Status   Specimen Description   Final    BLOOD RIGHT ARM Performed at [facility], [address] [place], [place] [address]    Special Requests   Final    BOTTLES DRAWN AEROBIC AND ANAEROBIC Blood Culture adequate volume Performed at [facility], [address] [place], [place] [address]    Culture   Final    NO GROWTH 2 DAYS Performed at [facility], [address] [place], [place] [address]    Report Status PENDING  Incomplete  Culture, blood (routine x 2)     Status: None (Preliminary result)   Collection Time: 07/12/19  4:00 PM   Specimen: BLOOD  Result Value Ref Range Status   Specimen Description   Final    BLOOD RIGHT ARM Performed at [facility], [address] [place], [place] [address]    Special Requests   Final    BOTTLES DRAWN AEROBIC ONLY Blood Culture adequate  volume Performed at [facility], [address], [place], [place] [address]    Culture   Final    NO GROWTH 2 DAYS Performed at [facility], [address], [place], [place] [address]    Report Status PENDING  Incomplete         Radiology Studies: DG CHEST PORT 1 VIEW  Result Date: 07/15/2019 CLINICAL DATA:  Dyspnea, hypertension, COVID-19 EXAM: PORTABLE CHEST 1 VIEW COMPARISON:  Portable exam 0619 hours compared to 07/12/2019 FINDINGS: Enlargement of cardiac silhouette. Diffuse BILATERAL airspace infiltrates consistent with multifocal pneumonia, perhaps minimally improved on RIGHT since prior exam. No pleural effusion or pneumothorax. Osseous structures stable. IMPRESSION: Diffuse BILATERAL pulmonary infiltrates consistent with multifocal pneumonia, perhaps minimally improved on RIGHT. Electronically Signed   By: Ulyses Southward M.D.   On: 07/15/2019 08:33        Scheduled Meds: . amLODipine  5 mg Oral Daily  . clopidogrel  75 mg Oral Daily  . docusate sodium  200 mg Oral BID  . enoxaparin (LOVENOX) injection  55 mg Subcutaneous Q24H  . furosemide  40 mg Intravenous BID  . influenza vac split quadrivalent PF  0.5 mL Intramuscular Tomorrow-1000  . insulin aspart  0-20 Units Subcutaneous TID WC  . insulin aspart  0-5 Units Subcutaneous QHS  . insulin aspart  5 Units Subcutaneous TID WC  . insulin glargine  25 Units Subcutaneous QHS  . metFORMIN  1,000 mg Oral Q supper  . methylPREDNISolone (SOLU-MEDROL) injection  60 mg Intravenous Q12H  . nebivolol  20 mg Oral Daily  . polyethylene glycol  17 g Oral Once  . primidone  250 mg Oral Daily   Continuous Infusions: . remdesivir 100 mg in NS 100 mL 100 mg (07/15/19 1112)     LOS: 3 days    Time spent: 37 minutes spent on chart review, discussion with nursing staff, consultants, updating family and interview/physical exam; more than 50% of that time was spent in counseling and/or coordination of  care.    Alvira Philips [place], DO [facility] Available via Epic secure chat 7am-7pm After these hours, please refer to coverage provider listed on [email or web address] 07/15/2019, 11:28 AM

## 2019-07-15 NOTE — Progress Notes (Addendum)
Occupational Therapy Treatment Patient Details Name: Frederick Gomez MRN: [id number] DOB: [birth year] Today's Date: 07/15/2019    History of present illness Frederick Gomez is a 59 y.o. male with past medical history remarkable for obesity, OSA noncompliant with CPAP, hypertension, TIA, DM type II who was diagnosed with COVID-19 pneumonia about 8 days ago (~07/04/19). He was admitted to [facility] on 07/12/23 due to severe acute hypoxic resp failure with resting SPO2 of 78% on room air prior to admission.   OT comments  Pt progressing towards OT goals with decreasing O2 amount needed. Pt previously on 15 L HFNC, now on 8 [place] at start of session. Pt able to complete functional transfers varying from Modified Independent to Supervision level with mobility within room at Supervision to [facility] guard +2 for safe line management. Pt able to complete grooming tasks while standing at sink >5 minutes Supervision level for safety. Pt with one brief drop to 85%, but able to recover quickly remaining 88%-90% during activity and no LOB noted. Instructed in pursed lip breathing and pacing with pt able to demonstrate, as well as flutter valve and incentive spirometer use (550mL) with minor cues for technique. Once resting in chair, pt remained in low-mid 90% - reduced to 7 L HFNC to assess response. Instructed pt in UE theraband HEP with minor tactile/verbal cues needed with demonstration to improve carryover. Stats remained in 90s during seated exercises.  Follow Up Recommendations  Home health OT    Equipment Recommendations  3 in 1 bedside commode    Recommendations for Other Services      Precautions / Restrictions Precautions Precautions: Fall;Other (comment)(Airborne) Restrictions Weight Bearing Restrictions: No       Mobility Bed Mobility               General bed mobility comments: Pt in chair for OT entry  Transfers Overall transfer level: Needs assistance Equipment used: Rolling walker (2  wheeled) Transfers: Sit to/from Stand;Stand Pivot Transfers Sit to Stand: Modified independent (Device/Increase time) Stand pivot transfers: Supervision(safe line management)       General transfer comment: Pt requires cueing for sequencing and safety    Balance Overall balance assessment: No apparent balance deficits (not formally assessed)                                         ADL either performed or assessed with clinical judgement   ADL Overall ADL's : Needs assistance/impaired Eating/Feeding: Independent   Grooming: Set up;Supervision/safety;Standing Grooming Details (indicate cue type and reason): Grooming tasks standing at sink                     Toileting- Water quality scientist and Hygiene: Supervision/safety               Vision       Perception     Praxis      Cognition Arousal/Alertness: Awake/alert Behavior During Therapy: WFL for tasks assessed/performed Overall Cognitive Status: Within Functional Limits for tasks assessed                                 General Comments: Pt motivated to participate with therapy        Exercises Exercises: Other exercises;General Upper Extremity General Exercises - Upper Extremity Shoulder Flexion: Seated;Theraband;10 reps Theraband Level (Shoulder Flexion): Level 1 (  Yellow) Shoulder ABduction: Seated;Theraband;10 reps Theraband Level (Shoulder Abduction): Level 1 (Yellow) Elbow Flexion: Seated;Theraband;10 reps Theraband Level (Elbow Flexion): Level 1 (Yellow) Elbow Extension: Seated;Theraband;10 reps Other Exercises Other Exercises: Flutter valve x 10 Other Exercises: incentive spirometer x 10 ( ) Other Exercises: Pursed lip breathing    Shoulder Instructions       General Comments      Pertinent Vitals/ Pain       Pain Assessment: No/denies pain  Home Living                                          Prior Functioning/Environment               Frequency  Min 3X/week        Progress Toward Goals  OT Goals(current goals can now be found in the care plan section)  Progress towards OT goals: Progressing toward goals  Acute Rehab OT Goals Patient Stated Goal: go home OT Goal Formulation: With patient Time For Goal Achievement: 07/27/19 Potential to Achieve Goals: Good ADL Goals Pt Will Perform Lower Body Dressing: with modified independence;sit to/from stand Pt Will Transfer to Toilet: ambulating;with modified independence Pt Will Perform Toileting - Clothing Manipulation and hygiene: with modified independence;sit to/from stand Pt/caregiver will Perform Home Exercise Program: Increased strength;With theraband;Independently Additional ADL Goal #1: Pt will demonstrate a minimum of 3 energy conservation strategies Independently during ADLs/IADLs in order to maintain O2 stats above 90%  Plan Discharge plan remains appropriate    Co-evaluation                 AM-PAC OT "6 Clicks" Daily Activity     Outcome Measure   Help from another person eating meals?: None Help from another person taking care of personal grooming?: A Little Help from another person toileting, which includes using toliet, bedpan, or urinal?: A Little Help from another person bathing (including washing, rinsing, drying)?: A Little Help from another person to put on and taking off regular upper body clothing?: A Little Help from another person to put on and taking off regular lower body clothing?: A Little 6 Click Score: 19    End of Session Equipment Utilized During Treatment: Gait belt;Rolling walker;Oxygen(8 L O2)  OT Visit Diagnosis: Muscle weakness (generalized) (M62.81)   Activity Tolerance Patient tolerated treatment well   Patient Left in chair;with call bell/phone within reach;with chair alarm set   Nurse Communication Other (comment);Mobility status(Response to 8 L O2)        Time: 1610-9604 OT Time  Calculation (min): 40 min  Charges: OT General Charges $OT Visit: 1 Visit OT Treatments $Self Care/Home Management : 23-37 mins $Therapeutic Exercise: 8-22 mins  Lorre Munroe, OTR/L   Lorre Munroe 07/15/2019, 1:19 PM

## 2019-07-16 LAB — COMPREHENSIVE METABOLIC PANEL
ALT: 34 U/L (ref 0–44)
AST: 35 U/L (ref 15–41)
Albumin: 2.9 g/dL — ABNORMAL LOW (ref 3.5–5.0)
Alkaline Phosphatase: 82 U/L (ref 38–126)
Anion gap: 12 (ref 5–15)
BUN: 31 mg/dL — ABNORMAL HIGH (ref 6–20)
CO2: 30 mmol/L (ref 22–32)
Calcium: 8.1 mg/dL — ABNORMAL LOW (ref 8.9–10.3)
Chloride: 91 mmol/L — ABNORMAL LOW (ref 98–111)
Creatinine, Ser: 1.19 mg/dL (ref 0.61–1.24)
GFR calc Af Amer: >60 mL/min (ref >60)
GFR calc non Af Amer: >60 mL/min (ref >60)
Glucose, Bld: 243 mg/dL — ABNORMAL HIGH (ref 70–99)
Potassium: 3.8 mmol/L (ref 3.5–5.1)
Sodium: 133 mmol/L — ABNORMAL LOW (ref 135–145)
Total Bilirubin: 0.8 mg/dL (ref 0.3–1.2)
Total Protein: 6.5 g/dL (ref 6.5–8.1)

## 2019-07-16 LAB — CBC WITH DIFFERENTIAL/PLATELET
Abs Immature Granulocytes: 0.15 10*3/uL — ABNORMAL HIGH (ref 0.00–0.07)
Basophils Absolute: 0 10*3/uL (ref 0.0–0.1)
Basophils Relative: 0 %
Eosinophils Absolute: 0 10*3/uL (ref 0.0–0.5)
Eosinophils Relative: 0 %
HCT: 43.6 % (ref 39.0–52.0)
Hemoglobin: 14.2 g/dL (ref 13.0–17.0)
Immature Granulocytes: 1 %
Lymphocytes Relative: 5 %
Lymphs Abs: 0.6 10*3/uL — ABNORMAL LOW (ref 0.7–4.0)
MCH: 28 pg (ref 26.0–34.0)
MCHC: 32.6 g/dL (ref 30.0–36.0)
MCV: 85.8 fL (ref 80.0–100.0)
Monocytes Absolute: 0.5 10*3/uL (ref 0.1–1.0)
Monocytes Relative: 5 %
Neutro Abs: 10.1 10*3/uL — ABNORMAL HIGH (ref 1.7–7.7)
Neutrophils Relative %: 89 %
Platelets: 317 10*3/uL (ref 150–400)
RBC: 5.08 MIL/uL (ref 4.22–5.81)
RDW: 12.4 % (ref 11.5–15.5)
WBC: 11.4 10*3/uL — ABNORMAL HIGH (ref 4.0–10.5)
nRBC: 0 % (ref 0.0–0.2)

## 2019-07-16 LAB — MAGNESIUM: Magnesium: 2.5 mg/dL — ABNORMAL HIGH (ref 1.7–2.4)

## 2019-07-16 LAB — BRAIN NATRIURETIC PEPTIDE: B Natriuretic Peptide: 129.3 pg/mL — ABNORMAL HIGH (ref 0.0–100.0)

## 2019-07-16 LAB — C-REACTIVE PROTEIN: CRP: 4.5 mg/dL — ABNORMAL HIGH (ref <1.0)

## 2019-07-16 LAB — D-DIMER, QUANTITATIVE: D-Dimer, Quant: 1.07 ug/mL-FEU — ABNORMAL HIGH (ref 0.00–0.50)

## 2019-07-16 MED ORDER — METHYLPREDNISOLONE SODIUM SUCC 40 MG IJ SOLR
40.0000 mg | Freq: Two times a day (BID) | INTRAMUSCULAR | Status: DC
  Administered 2019-07-16 – 2019-07-17 (×2): 40 mg via INTRAVENOUS
  Filled 2019-07-16 (×2): qty 1

## 2019-07-16 MED ORDER — INSULIN GLARGINE 100 UNIT/ML ~~LOC~~ SOLN
35.0000 [IU] | Freq: Every day | SUBCUTANEOUS | Status: DC
  Administered 2019-07-16 – 2019-07-17 (×2): 35 [IU] via SUBCUTANEOUS
  Filled 2019-07-16 (×2): qty 0.35

## 2019-07-16 NOTE — Progress Notes (Signed)
PROGRESS NOTE    Frederick Gomez  WPY:[id number] DOB: 1960-10-19 DOA: 07/12/2019 PCP: Ninfa Meeker, FNP    Brief Narrative:   Frederick Gomez is a 59 y.o. male with past medical history remarkable for obesity, OSA noncompliant with CPAP, hypertension, TIA, DM type II who was diagnosed with COVID-19 pneumonia about 8 days ago who [facility] with progressive shortness of breath and was found to be hypoxic with an SPO2 of 78% on room air. He was also noted to be tachypneic breathing between 35 and 50 breaths/min. He was admitted directly to [facility] for further care.    Assessment & Plan:   Active Problems:   COVID-19 virus infection  Acute hypoxic respiratory failure secondary to acute Covid-19 viral pneumonia during the ongoing 2020 Covid 19 Pandemic - POA Patient presenting from [facility] infusion clinic with tachypnea and was found to be hypoxic oxygenating 78% on room air. Chest x-ray with interval development of diffuse airspace opacities throughout both lungs consistent with multifocal pneumonia. --ddimer 1.52-->1.16-->0.98-->0.89-->1.07 --CRP 25.2-->26.6-->15.6-->8.2-->4.5 --s/p Actemra on 07/12/2019 and 07/13/2019 --Continue remdesivir, plan 5-day course; day #5/5 --Decrease Solu-Medrol from 60 mg to 40mg  IV every 12 hours --Furosemide 40 mg IV BID --Continue supplemental oxygen, titrate to maintain SPO2 greater than 92%; currently on 4 L HFNC w/ SPO2 95%; down from 8L yesterday; but hypoxia to 82% on ambulation w/ 6L yesterday --Continue supportive care with vitamin C, zinc, Tylenol --Follow CBC, CMP, D-dimer, ferritin, and CRP daily --Continue airborne/contact isolation precautions  Morbid obesity Obstructive sleep apnea Body mass index is 35.84 kg/m. Discussed with patient needs for aggressive weight loss measures and lifestyle changes as this complicates all facets of care. Also noncompliant with his home CPAP, counseled on need for use. --Continue  supplemental oxygen while inpatient, currently on 4 L HFNC  Essential hypertension: BP 124/77, well controlled. --Continue amlodipine 5 mg p.o. daily and Bystolic 20 mg p.o. daily --holding home furosemide 80 mg p.o. in favor of 40mg  IV BID  Dyslipidemia: Currently not on statin due to allergy, follow-up with PCP.  History of TIA: Continue Plavix; not on statin due to allergy.  Type 2 diabetes mellitus A1c 8.6, not optimally controlled. On Jardiance and Metformin outpatient. --Continue Metformin 1000 mg p.o. daily --Increase Lantus to 35u [place] daily and Novolog 5u TIDAC given elevated blood sugars in the setting of steroid use --Continue insulin sliding scale for coverage and will continue to monitor for further adjustments   DVT prophylaxis: Lovenox Code Status: Full code Family Communication: Updated patient extensively at bedside Disposition Plan: From home, currently unsafe discharge at this time given continued high FiO2 and significant hypoxia with ambulation; anticipate discharge home with home health PT/OT/SW when FiO2 requirements more reasonable.   Consultants:   None  Procedures:   None  Antimicrobials:   None   Subjective:  Patient seen and examined bedside, resting comfortably in bedside chair eating breakfast. Continues with significant dyspnea especially with any type of exertion; but reports small improvement each day. Currently on 4 L high flow nasal cannula, which is improved since yesterday; although working with therapy and ambulating desatted to 82% on 6 L nasal cannula.  No other specific complaints this morning. Denies fever, no headache, no chills/night sweats, no nausea/vomiting/diarrhea, no chest pain, no palpitations, no abdominal pain. No other acute events overnight per nursing staff.  Objective: Vitals:   07/16/19 0100 07/16/19 0515 07/16/19 0757 07/16/19 0920  BP:  115/75 111/77   Pulse: 09/13/19)  55 (!) 56 (!) 54   Resp: 16 (!) 21 (!) 32   Temp:   97.7 F (36.5 C) 97.7 F (36.5 C)   TempSrc:  Oral Oral   SpO2: 95% 93% 95% 99%  Weight:      Height:        Intake/Output Summary (Last 24 hours) at 07/16/2019 1039 Last data filed at 07/16/2019 0920 Gross per 24 hour  Intake 1600 ml  Output 3825 ml  Net -2225 ml   Filed Weights   07/12/19 1525  Weight: 108.5 kg    Examination:  General exam: Appears calm and comfortable  Respiratory system: Clear to auscultation. Slight increased respiratory effort, no accessory muscle use, on 4 L HFNC oxygenating 93% Cardiovascular system: S1 & S2 heard, RRR. No JVD, murmurs, rubs, gallops or clicks. No pedal edema. Gastrointestinal system: Abdomen is nondistended, soft and nontender. No organomegaly or masses felt. Normal bowel sounds heard. Central nervous system: Alert and oriented. No focal neurological deficits. Extremities: Symmetric 5 x 5 power. Skin: No rashes, lesions or ulcers Psychiatry: Judgement and insight appear normal. Mood & affect appropriate.     Data Reviewed: I have personally reviewed following labs and imaging studies  CBC: Recent Labs  Lab 07/12/19 1552 07/13/19 0210 07/14/19 0230 07/15/19 0132 07/16/19 0035  WBC 8.4 6.0 14.9* 12.9* 11.4*  NEUTROABS  --  5.8 13.8* 11.9* 10.1*  HGB 14.9 14.7 13.8 13.9 14.2  HCT 44.6 43.8 41.5 41.3 43.6  MCV 84.0 85.4 84.9 84.8 85.8  PLT 263 267 316 333 317   Basic Metabolic Panel: Recent Labs  Lab 07/12/19 1552 07/12/19 1552 07/12/19 1717 07/13/19 0210 07/14/19 0230 07/15/19 0132 07/16/19 0035  NA 133*   < > 136 137 135 136 133*  K 3.1*   < > 3.3* 4.4 3.8 3.6 3.8  CL 94*   < > 96* 98 96* 98 91*  CO2 27   < > 25 27 26 28 30   GLUCOSE 125*   < > 120* 252* 260* 164* 243*  BUN 12   < > 12 20 29* 31* 31*  CREATININE 1.13   < > 1.10 0.98 1.11 1.02 1.19  CALCIUM 8.3*   < > 8.3* 8.6* 8.5* 8.2* 8.1*  MG 2.0  --   --  2.4 2.2 2.3 2.5*   < > = values in this interval not displayed.   GFR: Estimated Creatinine  Clearance: 83.5 mL/min (by C-G formula based on SCr of 1.19 mg/dL). Liver Function Tests: Recent Labs  Lab 07/12/19 1717 07/13/19 0210 07/14/19 0230 07/15/19 0132 07/16/19 0035  AST 31 20 15 22  35  ALT 20 18 18 22  34  ALKPHOS 85 82 83 82 82  BILITOT 1.1 0.6 0.4 0.5 0.8  PROT 7.0 7.0 6.6 6.3* 6.5  ALBUMIN 3.0* 2.8* 2.6* 2.5* 2.9*   No results for input(s): LIPASE, AMYLASE in the last 168 hours. No results for input(s): AMMONIA in the last 168 hours. Coagulation Profile: No results for input(s): INR, PROTIME in the last 168 hours. Cardiac Enzymes: No results for input(s): CKTOTAL, CKMB, CKMBINDEX, TROPONINI in the last 168 hours. BNP (last 3 results) No results for input(s): PROBNP in the last 8760 hours. HbA1C: No results for input(s): HGBA1C in the last 72 hours. CBG: Recent Labs  Lab 07/14/19 1220 07/14/19 1642 07/14/19 2032 07/15/19 0039 07/15/19 0727  GLUCAP 353* 275* 219* 176* 168*   Lipid Profile: No results for input(s): CHOL, HDL, LDLCALC, TRIG, CHOLHDL, LDLDIRECT  in the last 72 hours. Thyroid Function Tests: No results for input(s): TSH, T4TOTAL, FREET4, T3FREE, THYROIDAB in the last 72 hours. Anemia Panel: No results for input(s): VITAMINB12, FOLATE, FERRITIN, TIBC, IRON, RETICCTPCT in the last 72 hours. Sepsis Labs: Recent Labs  Lab 07/12/19 1552 07/13/19 0210 07/14/19 0230  PROCALCITON 0.31 0.28 0.41    Recent Results (from the past 240 hour(s))  Culture, blood (routine x 2)     Status: None (Preliminary result)   Collection Time: 07/12/19  3:52 PM   Specimen: BLOOD  Result Value Ref Range Status   Specimen Description   Final    BLOOD RIGHT ARM Performed at [facility] [address], [place], [place] [address]    Special Requests   Final    BOTTLES DRAWN AEROBIC AND ANAEROBIC Blood Culture adequate volume Performed at [facility] [address], [place], [place] [address]    Culture   Final    NO  GROWTH 4 DAYS Performed at [facility], [place] [address], [place], [place] [address]    Report Status PENDING  Incomplete  Culture, blood (routine x 2)     Status: None (Preliminary result)   Collection Time: 07/12/19  4:00 PM   Specimen: BLOOD  Result Value Ref Range Status   Specimen Description   Final    BLOOD RIGHT ARM Performed at [facility] [address] [place], [place] [address]    Special Requests   Final    BOTTLES DRAWN AEROBIC ONLY Blood Culture adequate volume Performed at [facility] [address] [place], [place] [address]    Culture   Final    NO GROWTH 4 DAYS Performed at [facility], [place] [address], [place], [place] [address]    Report Status PENDING  Incomplete         Radiology Studies: DG CHEST PORT 1 VIEW  Result Date: 07/15/2019 CLINICAL DATA:  Dyspnea, hypertension, COVID-19 EXAM: PORTABLE CHEST 1 VIEW COMPARISON:  Portable exam 0619 hours compared to 07/12/2019 FINDINGS: Enlargement of cardiac silhouette. Diffuse BILATERAL airspace infiltrates consistent with multifocal pneumonia, perhaps minimally improved on RIGHT since prior exam. No pleural effusion or pneumothorax. Osseous structures stable. IMPRESSION: Diffuse BILATERAL pulmonary infiltrates consistent with multifocal pneumonia, perhaps minimally improved on RIGHT. Electronically Signed   By: Lavonia Dana M.D.   On: 07/15/2019 08:33        Scheduled Meds: . amLODipine  5 mg Oral Daily  . clopidogrel  75 mg Oral Daily  . docusate sodium  200 mg Oral BID  . enoxaparin (LOVENOX) injection  55 mg Subcutaneous Q24H  . furosemide  40 mg Intravenous BID  . influenza vac split quadrivalent PF  0.5 mL Intramuscular Tomorrow-1000  . insulin aspart  0-20 Units Subcutaneous TID WC  . insulin aspart  0-5 Units Subcutaneous QHS  . insulin aspart  5 Units Subcutaneous TID WC  . insulin glargine  25 Units Subcutaneous QHS  . metFORMIN  1,000 mg  Oral Q supper  . methylPREDNISolone (SOLU-MEDROL) injection  40 mg Intravenous Q12H  . nebivolol  20 mg Oral Daily  . primidone  250 mg Oral Daily   Continuous Infusions:    LOS: 4 days    Time spent: 36 minutes spent on chart review, discussion with nursing staff, consultants, updating family and interview/physical exam; more than 50% of that time was spent in counseling and/or coordination of care.    Randall Hiss  J [place], DO [facility] Available via Epic secure chat 7am-7pm After these hours, please refer to coverage provider listed on [email or web address] 07/16/2019, 10:39 AM

## 2019-07-17 LAB — CULTURE, BLOOD (ROUTINE X 2)
Culture: NO GROWTH
Culture: NO GROWTH
Special Requests: ADEQUATE
Special Requests: ADEQUATE

## 2019-07-17 LAB — CBC
HCT: 45.9 % (ref 39.0–52.0)
Hemoglobin: 15 g/dL (ref 13.0–17.0)
MCH: 28.3 pg (ref 26.0–34.0)
MCHC: 32.7 g/dL (ref 30.0–36.0)
MCV: 86.6 fL (ref 80.0–100.0)
Platelets: 305 10*3/uL (ref 150–400)
RBC: 5.3 MIL/uL (ref 4.22–5.81)
RDW: 12.5 % (ref 11.5–15.5)
WBC: 11.5 10*3/uL — ABNORMAL HIGH (ref 4.0–10.5)
nRBC: 0 % (ref 0.0–0.2)

## 2019-07-17 LAB — BASIC METABOLIC PANEL
Anion gap: 11 (ref 5–15)
BUN: 30 mg/dL — ABNORMAL HIGH (ref 6–20)
CO2: 31 mmol/L (ref 22–32)
Calcium: 8.3 mg/dL — ABNORMAL LOW (ref 8.9–10.3)
Chloride: 93 mmol/L — ABNORMAL LOW (ref 98–111)
Creatinine, Ser: 1.18 mg/dL (ref 0.61–1.24)
GFR calc Af Amer: >60 mL/min (ref >60)
GFR calc non Af Amer: >60 mL/min (ref >60)
Glucose, Bld: 148 mg/dL — ABNORMAL HIGH (ref 70–99)
Potassium: 3.6 mmol/L (ref 3.5–5.1)
Sodium: 135 mmol/L (ref 135–145)

## 2019-07-17 LAB — C-REACTIVE PROTEIN: CRP: 2 mg/dL — ABNORMAL HIGH (ref <1.0)

## 2019-07-17 LAB — MAGNESIUM: Magnesium: 2.4 mg/dL (ref 1.7–2.4)

## 2019-07-17 LAB — D-DIMER, QUANTITATIVE: D-Dimer, Quant: 1.19 ug/mL-FEU — ABNORMAL HIGH (ref 0.00–0.50)

## 2019-07-17 MED ORDER — METHYLPREDNISOLONE SODIUM SUCC 40 MG IJ SOLR
40.0000 mg | Freq: Every day | INTRAMUSCULAR | Status: DC
  Administered 2019-07-18 – 2019-07-19 (×2): 40 mg via INTRAVENOUS
  Filled 2019-07-17 (×2): qty 1

## 2019-07-17 NOTE — Progress Notes (Signed)
PROGRESS NOTE    Frederick Gomez  ZYS:[id number] DOB: Aug 30, 1960 DOA: 07/12/2019 PCP: Ninfa Meeker, FNP    Brief Narrative:   Frederick Gomez is a 59 y.o. male with past medical history remarkable for obesity, OSA noncompliant with CPAP, hypertension, TIA, DM type II who was diagnosed with COVID-19 pneumonia about 8 days ago who [facility] with progressive shortness of breath and was found to be hypoxic with an SPO2 of 78% on room air. He was also noted to be tachypneic breathing between 35 and 50 breaths/min. He was admitted directly to [facility] for further care.    Assessment & Plan:   Active Problems:   COVID-19 virus infection  Acute hypoxic respiratory failure secondary to acute Covid-19 viral pneumonia during the ongoing 2020 Covid 19 Pandemic - POA Patient presenting from [facility] infusion clinic with tachypnea and was found to be hypoxic oxygenating 78% on room air. Chest x-ray with interval development of diffuse airspace opacities throughout both lungs consistent with multifocal pneumonia. --ddimer 1.52-->1.16-->0.98-->0.89-->1.07 --CRP 25.2-->26.6-->15.6-->8.2-->4.5 --s/p Actemra on 07/12/2019 and 07/13/2019 --Continue remdesivir, plan 5-day course; day #5/5 --Decrease Solu-Medrol from 40mg  IV every 12 hours to daily --Furosemide 40 mg IV BID --Continue supplemental oxygen, titrate to maintain SPO2 greater than 92%; currently on room air at rest, but did require 10 L with ambulation as he desatted down to 80% today. --Continue supportive care with vitamin C, zinc, Tylenol --Follow CBC, CMP, D-dimer, ferritin, and CRP daily --Continue airborne/contact isolation precautions  Morbid obesity Obstructive sleep apnea Body mass index is 35.84 kg/m. Discussed with patient needs for aggressive weight loss measures and lifestyle changes as this complicates all facets of care. Also noncompliant with his home CPAP, counseled on need for use. --Continue supplemental  oxygen while inpatient, currently on room air, but significant hypoxia continues with exertion  Essential hypertension: BP 124/77, well controlled. --Continue amlodipine 5 mg p.o. daily and Bystolic 20 mg p.o. daily --holding home furosemide 80 mg p.o. in favor of 40mg  IV BID  Dyslipidemia: Currently not on statin due to allergy, follow-up with PCP.  History of TIA: Continue Plavix; not on statin due to allergy.  Type 2 diabetes mellitus A1c 8.6, not optimally controlled. On Jardiance and Metformin outpatient. --Continue Metformin 1000 mg p.o. daily --Increase Lantus to 35u [place] daily and Novolog 5u TIDAC given elevated blood sugars in the setting of steroid use --Continue insulin sliding scale for coverage and will continue to monitor for further adjustments   DVT prophylaxis: Lovenox Code Status: Full code Family Communication: Updated patient extensively at bedside Disposition Plan: From home, currently unsafe discharge at this time given continued significant hypoxia with ambulation; anticipate discharge home with home health PT/OT/SW when FiO2 requirements more reasonable; anticipate discharge next 24-48 hours   Consultants:   None  Procedures:   None  Antimicrobials:   None   Subjective:  Patient seen and examined bedside, resting comfortably in bedside chair eating breakfast.  Now titrated down to room air, but with significant hypoxia with ambulation this morning, requiring 10 L for recovery.  Overall feels improved each day. No other specific complaints this morning. Denies fever, no headache, no chills/night sweats, no nausea/vomiting/diarrhea, no chest pain, no palpitations, no abdominal pain. No other acute events overnight per nursing staff.  Objective: Vitals:   07/16/19 1930 07/16/19 2330 07/17/19 0455 07/17/19 0746  BP: 124/76 130/76 127/84 116/71  Pulse: 67 74 (!) 57 69  Resp: (!) 24 (!) 22 16 (!) 24  Temp: 98.3 F (36.8 C) 98.4 F (36.9 C) 98 F (36.7  C) 97.7 F (36.5 C)  TempSrc: Oral Oral Oral Oral  SpO2: 95% 98% 92% 91%  Weight:      Height:        Intake/Output Summary (Last 24 hours) at 07/17/2019 1224 Last data filed at 07/17/2019 1210 Gross per 24 hour  Intake 2600 ml  Output 2675 ml  Net -75 ml   Filed Weights   07/12/19 1525  Weight: 108.5 kg    Examination:  General exam: Appears calm and comfortable  Respiratory system: Clear to auscultation. Slight increased respiratory effort, no accessory muscle use, on room air oxygenating 92%, but with ambulation was hypoxic today to 80% and required 10 L to finish 6-minute walk. Cardiovascular system: S1 & S2 heard, RRR. No JVD, murmurs, rubs, gallops or clicks. No pedal edema. Gastrointestinal system: Abdomen is nondistended, soft and nontender. No organomegaly or masses felt. Normal bowel sounds heard. Central nervous system: Alert and oriented. No focal neurological deficits. Extremities: Symmetric 5 x 5 power. Skin: No rashes, lesions or ulcers Psychiatry: Judgement and insight appear normal. Mood & affect appropriate.     Data Reviewed: I have personally reviewed following labs and imaging studies  CBC: Recent Labs  Lab 07/13/19 0210 07/14/19 0230 07/15/19 0132 07/16/19 0035 07/17/19 0255  WBC 6.0 14.9* 12.9* 11.4* 11.5*  NEUTROABS 5.8 13.8* 11.9* 10.1*  --   HGB 14.7 13.8 13.9 14.2 15.0  HCT 43.8 41.5 41.3 43.6 45.9  MCV 85.4 84.9 84.8 85.8 86.6  PLT 267 316 333 317 322   Basic Metabolic Panel: Recent Labs  Lab 07/13/19 0210 07/14/19 0230 07/15/19 0132 07/16/19 0035 07/17/19 0255  NA 137 135 136 133* 135  K 4.4 3.8 3.6 3.8 3.6  CL 98 96* 98 91* 93*  CO2 27 26 28 30 31   GLUCOSE 252* 260* 164* 243* 148*  BUN 20 29* 31* 31* 30*  CREATININE 0.98 1.11 1.02 1.19 1.18  CALCIUM 8.6* 8.5* 8.2* 8.1* 8.3*  MG 2.4 2.2 2.3 2.5* 2.4   GFR: Estimated Creatinine Clearance: 84.2 mL/min (by C-G formula based on SCr of 1.18 mg/dL). Liver Function Tests:  Recent Labs  Lab 07/12/19 1717 07/13/19 0210 07/14/19 0230 07/15/19 0132 07/16/19 0035  AST 31 20 15 22  35  ALT 20 18 18 22  34  ALKPHOS 85 82 83 82 82  BILITOT 1.1 0.6 0.4 0.5 0.8  PROT 7.0 7.0 6.6 6.3* 6.5  ALBUMIN 3.0* 2.8* 2.6* 2.5* 2.9*   No results for input(s): LIPASE, AMYLASE in the last 168 hours. No results for input(s): AMMONIA in the last 168 hours. Coagulation Profile: No results for input(s): INR, PROTIME in the last 168 hours. Cardiac Enzymes: No results for input(s): CKTOTAL, CKMB, CKMBINDEX, TROPONINI in the last 168 hours. BNP (last 3 results) No results for input(s): PROBNP in the last 8760 hours. HbA1C: No results for input(s): HGBA1C in the last 72 hours. CBG: Recent Labs  Lab 07/14/19 1220 07/14/19 1642 07/14/19 2032 07/15/19 0039 07/15/19 0727  GLUCAP 353* 275* 219* 176* 168*   Lipid Profile: No results for input(s): CHOL, HDL, LDLCALC, TRIG, CHOLHDL, LDLDIRECT in the last 72 hours. Thyroid Function Tests: No results for input(s): TSH, T4TOTAL, FREET4, T3FREE, THYROIDAB in the last 72 hours. Anemia Panel: No results for input(s): VITAMINB12, FOLATE, FERRITIN, TIBC, IRON, RETICCTPCT in the last 72 hours. Sepsis Labs: Recent Labs  Lab 07/12/19 1552 07/13/19 0210 07/14/19 0230  PROCALCITON 0.31 0.28  0.41    Recent Results (from the past 240 hour(s))  Culture, blood (routine x 2)     Status: None   Collection Time: 07/12/19  3:52 PM   Specimen: BLOOD  Result Value Ref Range Status   Specimen Description   Final    BLOOD RIGHT ARM Performed at [facility], [address], [place], [place] [address]    Special Requests   Final    BOTTLES DRAWN AEROBIC AND ANAEROBIC Blood Culture adequate volume Performed at [facility], [address], [place], [place] [address]    Culture   Final    NO GROWTH 5 DAYS Performed at [facility], [address], [place], [place] [address]    Report Status  07/17/2019 FINAL  Final  Culture, blood (routine x 2)     Status: None   Collection Time: 07/12/19  4:00 PM   Specimen: BLOOD  Result Value Ref Range Status   Specimen Description   Final    BLOOD RIGHT ARM Performed at [facility], [address], [place], [place] [address]    Special Requests   Final    BOTTLES DRAWN AEROBIC ONLY Blood Culture adequate volume Performed at [facility], [address] [place], [place] [address]    Culture   Final    NO GROWTH 5 DAYS Performed at [facility], [address], [place], [place] [address]    Report Status 07/17/2019 FINAL  Final         Radiology Studies: No results found.      Scheduled Meds: . amLODipine  5 mg Oral Daily  . clopidogrel  75 mg Oral Daily  . docusate sodium  200 mg Oral BID  . enoxaparin (LOVENOX) injection  55 mg Subcutaneous Q24H  . furosemide  40 mg Intravenous BID  . insulin aspart  0-20 Units Subcutaneous TID WC  . insulin aspart  0-5 Units Subcutaneous QHS  . insulin aspart  5 Units Subcutaneous TID WC  . insulin glargine  35 Units Subcutaneous QHS  . metFORMIN  1,000 mg Oral Q supper  . methylPREDNISolone (SOLU-MEDROL) injection  40 mg Intravenous Q12H  . nebivolol  20 mg Oral Daily  . primidone  250 mg Oral Daily   Continuous Infusions:    LOS: 5 days    Time spent: 36 minutes spent on chart review, discussion with nursing staff, consultants, updating family and interview/physical exam; more than 50% of that time was spent in counseling and/or coordination of care.    Alvira Philips [place], DO [facility] Available via Epic secure chat 7am-7pm After these hours, please refer to coverage provider listed on [email or web address] 07/17/2019, 12:24 PM

## 2019-07-18 LAB — GLUCOSE, CAPILLARY
Glucose-Capillary: 213 mg/dL — ABNORMAL HIGH (ref 70–99)
Glucose-Capillary: 165 mg/dL — ABNORMAL HIGH (ref 70–99)
Glucose-Capillary: 212 mg/dL — ABNORMAL HIGH (ref 70–99)
Glucose-Capillary: 162 mg/dL — ABNORMAL HIGH (ref 70–99)
Glucose-Capillary: 223 mg/dL — ABNORMAL HIGH (ref 70–99)
Glucose-Capillary: 174 mg/dL — ABNORMAL HIGH (ref 70–99)
Glucose-Capillary: 283 mg/dL — ABNORMAL HIGH (ref 70–99)
Glucose-Capillary: 152 mg/dL — ABNORMAL HIGH (ref 70–99)
Glucose-Capillary: 208 mg/dL — ABNORMAL HIGH (ref 70–99)
Glucose-Capillary: 183 mg/dL — ABNORMAL HIGH (ref 70–99)
Glucose-Capillary: 118 mg/dL — ABNORMAL HIGH (ref 70–99)
Glucose-Capillary: 113 mg/dL — ABNORMAL HIGH (ref 70–99)
Glucose-Capillary: 122 mg/dL — ABNORMAL HIGH (ref 70–99)

## 2019-07-18 LAB — BASIC METABOLIC PANEL
Anion gap: 13 (ref 5–15)
BUN: 31 mg/dL — ABNORMAL HIGH (ref 6–20)
CO2: 31 mmol/L (ref 22–32)
Calcium: 8.5 mg/dL — ABNORMAL LOW (ref 8.9–10.3)
Chloride: 92 mmol/L — ABNORMAL LOW (ref 98–111)
Creatinine, Ser: 1.21 mg/dL (ref 0.61–1.24)
GFR calc Af Amer: >60 mL/min (ref >60)
GFR calc non Af Amer: >60 mL/min (ref >60)
Glucose, Bld: 85 mg/dL (ref 70–99)
Potassium: 3.3 mmol/L — ABNORMAL LOW (ref 3.5–5.1)
Sodium: 136 mmol/L (ref 135–145)

## 2019-07-18 LAB — D-DIMER, QUANTITATIVE: D-Dimer, Quant: 1.33 ug/mL-FEU — ABNORMAL HIGH (ref 0.00–0.50)

## 2019-07-18 LAB — C-REACTIVE PROTEIN: CRP: 1.1 mg/dL — ABNORMAL HIGH (ref <1.0)

## 2019-07-18 MED ORDER — INSULIN GLARGINE 100 UNIT/ML ~~LOC~~ SOLN
15.0000 [IU] | Freq: Every day | SUBCUTANEOUS | Status: DC
  Administered 2019-07-18: 21:00:00 15 [IU] via SUBCUTANEOUS
  Filled 2019-07-18 (×2): qty 0.15

## 2019-07-18 MED ORDER — POTASSIUM CHLORIDE CRYS ER 20 MEQ PO TBCR
30.0000 meq | EXTENDED_RELEASE_TABLET | ORAL | Status: AC
  Administered 2019-07-18 (×2): 30 meq via ORAL
  Filled 2019-07-18 (×2): qty 2

## 2019-07-18 NOTE — Progress Notes (Signed)
SATURATION QUALIFICATIONS: (This note is used to comply with regulatory documentation for home oxygen)  Patient Saturations on Room Air at Rest = 94%  Patient Saturations on Room Air while Ambulating = 85%  Patient Saturations on 3 Liters of oxygen while Ambulating = 89%  Please briefly explain why patient needs home oxygen:  Pt completed 6 min walk with Primary RN. Pt required at least 3L to keep O2 saturation above 88%. Pt did not complain of any lightheadedness, SOB, or fatigue during or after walk. Vitals remained stable before and after. Provider notified.

## 2019-07-18 NOTE — Progress Notes (Signed)
PROGRESS NOTE    Neri Samek  SNK:[id number] DOB: 01/25/1961 DOA: 07/12/2019 PCP: Leilani Able, FNP    Brief Narrative:   Christphor Groft is a 59 y.o. male with past medical history remarkable for obesity, OSA noncompliant with CPAP, hypertension, TIA, DM type II who was diagnosed with COVID-19 pneumonia about 8 days ago who presented from the [facility] with progressive shortness of breath and was found to be hypoxic with an SPO2 of 78% on room air. He was also noted to be tachypneic breathing between 35 and 50 breaths/min. He was admitted directly to [facility] for further care.    Assessment & Plan:   Active Problems:   COVID-19 virus infection  Acute hypoxic respiratory failure secondary to acute Covid-19 viral pneumonia during the ongoing 2020 Covid 19 Pandemic - POA Patient presenting from [facility] infusion clinic with tachypnea and was found to be hypoxic oxygenating 78% on room air. Chest x-ray with interval development of diffuse airspace opacities throughout both lungs consistent with multifocal pneumonia. --ddimer 1.52-->1.16-->0.98-->0.89-->1.07 --CRP 25.2-->26.6-->15.6-->8.2-->4.5 --s/p Actemra on 07/12/2019 and 07/13/2019 --completed remdesivir 5-day course on 2/7 --Continue Solu-Medrol 40mg  IV q24h --Continue supplemental oxygen, titrate to maintain SPO2 greater than 92%; currently on room air at rest, but did have significant desaturation while ambulating requiring 10 L yesterday --Continue supportive care with vitamin C, zinc, Tylenol --Follow CBC, CMP, D-dimer, ferritin, and CRP daily --Continue airborne/contact isolation precautions --Repeat ambulatory saturations today  Morbid obesity Obstructive sleep apnea Body mass index is 35.84 kg/m. Discussed with patient needs for aggressive weight loss measures and lifestyle changes as this complicates all facets of care. Also noncompliant with his home CPAP, counseled on need for use. --Continue supplemental oxygen while  inpatient, currently on room air, but significant hypoxia continues with exertion  Essential hypertension: BP 124/77, well controlled. --Continue amlodipine 5 mg p.o. daily and Bystolic 20 mg p.o. daily  Dyslipidemia: Currently not on statin due to allergy, follow-up with PCP.  History of TIA: Continue Plavix; not on statin due to allergy.  Type 2 diabetes mellitus A1c 8.6, not optimally controlled. On Jardiance and Metformin outpatient. --Continue Metformin 1000 mg p.o. daily --Lantus 15u [place] daily and Novolog 5u TIDAC given elevated blood sugars in the setting of steroid use --Continue insulin sliding scale for coverage and will continue to monitor for further adjustments   DVT prophylaxis: Lovenox Code Status: Full code Family Communication: Updated patient extensively at bedside Disposition Plan: From home, currently unsafe discharge at this time given continued significant hypoxia with ambulation; anticipate discharge home with home health PT/OT/SW when FiO2 requirements more reasonable; anticipate discharge next 24-48 hours; need to repeat ambulatory saturations today; if reasonable, less than or equal to 6 L, may be able to discharge later this afternoon.   Consultants:   None  Procedures:   None  Antimicrobials:   None   Subjective:  Patient seen and examined bedside, resting comfortably in bedside chair eating breakfast.  Now titrated down to room air, but with significant hypoxia with ambulation yesterday, requiring 10 L for recovery.  Overall feels improved each day.  Excited to hopefully discharge home soon.  No other specific complaints this morning. Denies fever, no headache, no chills/night sweats, no nausea/vomiting/diarrhea, no chest pain, no palpitations, no abdominal pain. No other acute events overnight per nursing staff.  Objective: Vitals:   07/17/19 1550 07/17/19 2018 07/18/19 0516 07/18/19 0737  BP: 119/78 112/68 122/77 122/83  Pulse: 61 (!) 58 (!)  58 (!) 59  Resp: 16 (!) 33 (!) 31 (!) 33  Temp: 98.7 F (37.1 C) 97.9 F (36.6 C) 98.1 F (36.7 C) 98 F (36.7 C)  TempSrc: Oral Oral Axillary Oral  SpO2: 96% 92% 97% 96%  Weight:      Height:        Intake/Output Summary (Last 24 hours) at 07/18/2019 1218 Last data filed at 07/18/2019 0600 Gross per 24 hour  Intake 300 ml  Output 900 ml  Net -600 ml   Filed Weights   07/12/19 1525  Weight: 108.5 kg    Examination:  General exam: Appears calm and comfortable  Respiratory system: Clear to auscultation. Slight increased respiratory effort, no accessory muscle use, on room air oxygenating 92%, but with ambulation was hypoxic yesterday to 80% and required 10 L to finish 6-minute walk. Cardiovascular system: S1 & S2 heard, RRR. No JVD, murmurs, rubs, gallops or clicks. No pedal edema. Gastrointestinal system: Abdomen is nondistended, soft and nontender. No organomegaly or masses felt. Normal bowel sounds heard. Central nervous system: Alert and oriented. No focal neurological deficits. Extremities: Symmetric 5 x 5 power. Skin: No rashes, lesions or ulcers Psychiatry: Judgement and insight appear normal. Mood & affect appropriate.     Data Reviewed: I have personally reviewed following labs and imaging studies  CBC: Recent Labs  Lab 07/13/19 0210 07/14/19 0230 07/15/19 0132 07/16/19 0035 07/17/19 0255  WBC 6.0 14.9* 12.9* 11.4* 11.5*  NEUTROABS 5.8 13.8* 11.9* 10.1*  --   HGB 14.7 13.8 13.9 14.2 15.0  HCT 43.8 41.5 41.3 43.6 45.9  MCV 85.4 84.9 84.8 85.8 86.6  PLT 267 316 333 317 305   Basic Metabolic Panel: Recent Labs  Lab 07/13/19 0210 07/13/19 0210 07/14/19 0230 07/15/19 0132 07/16/19 0035 07/17/19 0255 07/18/19 0350  NA 137   < > 135 136 133* 135 136  K 4.4   < > 3.8 3.6 3.8 3.6 3.3*  CL 98   < > 96* 98 91* 93* 92*  CO2 27   < > 26 28 30 31 31   GLUCOSE 252*   < > 260* 164* 243* 148* 85  BUN 20   < > 29* 31* 31* 30* 31*  CREATININE 0.98   < > 1.11 1.02  1.19 1.18 1.21  CALCIUM 8.6*   < > 8.5* 8.2* 8.1* 8.3* 8.5*  MG 2.4  --  2.2 2.3 2.5* 2.4  --    < > = values in this interval not displayed.   GFR: Estimated Creatinine Clearance: 82.1 mL/min (by C-G formula based on SCr of 1.21 mg/dL). Liver Function Tests: Recent Labs  Lab 07/12/19 1717 07/13/19 0210 07/14/19 0230 07/15/19 0132 07/16/19 0035  AST 31 20 15 22  35  ALT 20 18 18 22  34  ALKPHOS 85 82 83 82 82  BILITOT 1.1 0.6 0.4 0.5 0.8  PROT 7.0 7.0 6.6 6.3* 6.5  ALBUMIN 3.0* 2.8* 2.6* 2.5* 2.9*   No results for input(s): LIPASE, AMYLASE in the last 168 hours. No results for input(s): AMMONIA in the last 168 hours. Coagulation Profile: No results for input(s): INR, PROTIME in the last 168 hours. Cardiac Enzymes: No results for input(s): CKTOTAL, CKMB, CKMBINDEX, TROPONINI in the last 168 hours. BNP (last 3 results) No results for input(s): PROBNP in the last 8760 hours. HbA1C: No results for input(s): HGBA1C in the last 72 hours. CBG: Recent Labs  Lab 07/14/19 2032 07/15/19 0039 07/15/19 0727 07/17/19 2156 07/18/19 0727  GLUCAP 219* 176* 168* 118* 113*  Lipid Profile: No results for input(s): CHOL, HDL, LDLCALC, TRIG, CHOLHDL, LDLDIRECT in the last 72 hours. Thyroid Function Tests: No results for input(s): TSH, T4TOTAL, FREET4, T3FREE, THYROIDAB in the last 72 hours. Anemia Panel: No results for input(s): VITAMINB12, FOLATE, FERRITIN, TIBC, IRON, RETICCTPCT in the last 72 hours. Sepsis Labs: Recent Labs  Lab 07/12/19 1552 07/13/19 0210 07/14/19 0230  PROCALCITON 0.31 0.28 0.41    Recent Results (from the past 240 hour(s))  Culture, blood (routine x 2)     Status: None   Collection Time: 07/12/19  3:52 PM   Specimen: BLOOD  Result Value Ref Range Status   Specimen Description   Final    BLOOD RIGHT ARM Performed at [facility], [address], [place], [place] [address]    Special Requests   Final    BOTTLES DRAWN AEROBIC AND  ANAEROBIC Blood Culture adequate volume Performed at [facility], [address], [place], [place] [address]    Culture   Final    NO GROWTH 5 DAYS Performed at [facility], [address], [place], [place] [address]    Report Status 07/17/2019 FINAL  Final  Culture, blood (routine x 2)     Status: None   Collection Time: 07/12/19  4:00 PM   Specimen: BLOOD  Result Value Ref Range Status   Specimen Description   Final    BLOOD RIGHT ARM Performed at [facility], [address] [place], [place] [address]    Special Requests   Final    BOTTLES DRAWN AEROBIC ONLY Blood Culture adequate volume Performed at [facility], [address], [place], [place] [address]    Culture   Final    NO GROWTH 5 DAYS Performed at [facility], [address], [place], [place] [address]    Report Status 07/17/2019 FINAL  Final         Radiology Studies: No results found.      Scheduled Meds: . amLODipine  5 mg Oral Daily  . clopidogrel  75 mg Oral Daily  . docusate sodium  200 mg Oral BID  . enoxaparin (LOVENOX) injection  55 mg Subcutaneous Q24H  . insulin aspart  0-20 Units Subcutaneous TID WC  . insulin aspart  0-5 Units Subcutaneous QHS  . insulin aspart  5 Units Subcutaneous TID WC  . insulin glargine  15 Units Subcutaneous QHS  . metFORMIN  1,000 mg Oral Q supper  . methylPREDNISolone (SOLU-MEDROL) injection  40 mg Intravenous Daily  . nebivolol  20 mg Oral Daily  . primidone  250 mg Oral Daily   Continuous Infusions:    LOS: 6 days    Time spent: 32 minutes spent on chart review, discussion with nursing staff, consultants, updating family and interview/physical exam; more than 50% of that time was spent in counseling and/or coordination of care.    Alvira Philips [place], DO [facility] Available via Epic secure chat 7am-7pm After these hours, please refer to coverage provider listed on  [email or web address] 07/18/2019, 12:18 PM

## 2019-07-18 NOTE — Progress Notes (Deleted)
Pt completed 6 min walk with Primary RN. Pt required at least 3L to keep O2 saturation above 88%. Pt did not complain of any lightheadedness, SOB, or fatigue during or after walk. Vitals remained stable before and after. Provider notified.

## 2019-07-18 NOTE — Progress Notes (Signed)
Physical Therapy Treatment Patient Details Name: Frederick Gomez MRN: [id number] DOB: [birth year] Today's Date: 07/18/2019    History of Present Illness Frederick Gomez is a 59 y.o. male with past medical history remarkable for obesity, OSA noncompliant with CPAP, hypertension, TIA, DM type II who was diagnosed with COVID-19 pneumonia about 8 days ago (~07/04/19). He was admitted to [facility] on 07/12/23 due to severe acute hypoxic resp failure with resting SPO2 of 78% on room air prior to admission.    PT Comments    No adverse effects this session. Remains very motivated. He was on RA and in BS chair when PT arrived this afternoon. No s/s of shortness of breath. He ambulated with no supplemental oxygen and no desatting (maintained above 90%) did bring portable O2 tank with the ambulation- used RW. Practiced IS and Flutter Valve unit with good return demo. Should continue to benefit from skilled PT to further address optimal functional outcomes.   Follow Up Recommendations  No PT follow up     Equipment Recommendations  3in1 (PT)    Recommendations for Other Services       Precautions / Restrictions Precautions Precautions: Fall Restrictions Weight Bearing Restrictions: No    Mobility  Bed Mobility Overal bed mobility: Independent             General bed mobility comments: Pt in chair for PT entry  Transfers Overall transfer level: Needs assistance Equipment used: Rolling walker (2 wheeled) Transfers: Sit to/from Stand Sit to Stand: Supervision Stand pivot transfers: Supervision          Ambulation/Gait Ambulation/Gait assistance: Supervision Gait Distance (Feet): 175 Feet Assistive device: (states he had a cane (which he prefers) but he lost it recently) Gait Pattern/deviations: Step-through pattern   Gait velocity interpretation: 1.31 - 2.62 ft/sec, indicative of limited community Industrial/product designer [place] (Stroke  Patients Only)       Balance Overall balance assessment: No apparent balance deficits (not formally assessed)                                          Cognition Arousal/Alertness: Awake/alert Behavior During Therapy: WFL for tasks assessed/performed Overall Cognitive Status: Within Functional Limits for tasks assessed                                 General Comments: He is very motivated. On RA when PT entered room today. Calm with no shortness of breath. "Anxious to walk".      Exercises Other Exercises Other Exercises: incentive spirometry & flutter valve x 10 ea prior to ambulation Other Exercises: incentive spirometer x 10 (1000)) Other Exercises: Pursed lip breathing     General Comments General comments (skin integrity, edema, etc.): He continues to demonstrate high motivation, very receptive to PT. Did take portable Oxygen tank with [place] during ambulation, but he was on RA the whole time- maintained above 90%      Pertinent Vitals/Pain      Home Living                      Prior Function            PT Goals (current goals can  now be found in the care plan section) Acute Rehab PT Goals Patient Stated Goal: go home PT Goal Formulation: With patient Time For Goal Achievement: 07/22/19 Potential to Achieve Goals: Good Progress towards PT goals: Progressing toward goals    Frequency    Min 3X/week      PT Plan Current plan remains appropriate    Co-evaluation              AM-PAC PT "6 Clicks" Mobility   Outcome Measure  Help needed turning from your back to your side while in a flat bed without using bedrails?: None Help needed moving from lying on your back to sitting on the side of a flat bed without using bedrails?: None Help needed moving to and from a bed to a chair (including a wheelchair)?: A Little Help needed standing up from a chair using your arms (e.g., wheelchair or bedside chair)?: A  Little Help needed to walk in hospital room?: A Little Help needed climbing 3-5 steps with a railing? : A Little 6 Click Score: 20    End of Session   Activity Tolerance: Patient tolerated treatment well Patient left: in chair;with call bell/phone within reach Nurse Communication: Mobility status PT Visit Diagnosis: Muscle weakness (generalized) (M62.81)     Time: 6384-6659 PT Time Calculation (min) (ACUTE ONLY): 36 min  Charges:  $Gait Training: 8-22 mins $Therapeutic Exercise: 8-22 mins                    Rollen Sox, PT # [phone] CGV cell   Casandra Doffing 07/18/2019, 4:35 PM

## 2019-07-18 NOTE — Progress Notes (Signed)
Patient deferred family update. 

## 2019-07-19 LAB — BASIC METABOLIC PANEL
Anion gap: 13 (ref 5–15)
BUN: 30 mg/dL — ABNORMAL HIGH (ref 6–20)
CO2: 28 mmol/L (ref 22–32)
Calcium: 8.4 mg/dL — ABNORMAL LOW (ref 8.9–10.3)
Chloride: 96 mmol/L — ABNORMAL LOW (ref 98–111)
Creatinine, Ser: 1 mg/dL (ref 0.61–1.24)
GFR calc Af Amer: >60 mL/min (ref >60)
GFR calc non Af Amer: >60 mL/min (ref >60)
Glucose, Bld: 79 mg/dL (ref 70–99)
Potassium: 3.5 mmol/L (ref 3.5–5.1)
Sodium: 137 mmol/L (ref 135–145)

## 2019-07-19 LAB — D-DIMER, QUANTITATIVE: D-Dimer, Quant: 1.36 ug/mL-FEU — ABNORMAL HIGH (ref 0.00–0.50)

## 2019-07-19 LAB — GLUCOSE, CAPILLARY
Glucose-Capillary: 95 mg/dL (ref 70–99)
Glucose-Capillary: 243 mg/dL — ABNORMAL HIGH (ref 70–99)
Glucose-Capillary: 118 mg/dL — ABNORMAL HIGH (ref 70–99)
Glucose-Capillary: 217 mg/dL — ABNORMAL HIGH (ref 70–99)

## 2019-07-19 LAB — C-REACTIVE PROTEIN: CRP: 0.8 mg/dL (ref <1.0)

## 2019-07-19 MED ORDER — DEXAMETHASONE 6 MG PO TABS: 6.0000 mg | ORAL_TABLET | Freq: Two times a day (BID) | ORAL | 0 refills | Status: AC

## 2019-07-19 MED ORDER — ALBUTEROL SULFATE HFA 108 (90 BASE) MCG/ACT IN AERS: 2.0000 | INHALATION_SPRAY | Freq: Four times a day (QID) | RESPIRATORY_TRACT | 0 refills | Status: AC | PRN

## 2019-07-19 NOTE — Progress Notes (Signed)
Pt left floor in wheelchair with belongings. Transport will bring pt to his residence.

## 2019-07-19 NOTE — TOC Progression Note (Signed)
Transition of Care [facility]) - Progression Note    Patient Details  Name: Frederick Gomez MRN: [id number] Date of Birth: [birth year]  [facility] CM/SW Contact  Armanda Heritage, RN Phone Number: 07/19/2019, 10:44 AM  Clinical Narrative:    Patient plans to dc home today, set up with [facility] for HHPT/OT/aide/social work.  [employer] to provide dme 3in1, to be delivered by [facility] to bedside. [facility] to provide home O2.  Patient reports he does not have any way to get home from hospital.  CM verified that [facility] will be able to transport patient home today.  Patient will be provided portable tank by [employer] for transportation.      Expected Discharge Plan: Home w Home Health Services Barriers to Discharge: No Barriers Identified  Expected Discharge Plan and Services Expected Discharge Plan: Home w Home Health Services   Discharge Planning Services: CM Consult Post Acute Care Choice: Home Health, Durable Medical Equipment Living arrangements for the past 2 months: Single Family Home Expected Discharge Date: 07/19/19               DME Arranged: 3-N-1, Oxygen DME Agency: [employer] for 3in1) Date DME Agency Contacted: 07/19/19 Time DME Agency Contacted: 68 Representative spoke with at DME Agency: Morrie [employer] HH Arranged: PT, OT, Nurse's Aide, Social Work [employer] Agency: [facility] Date [facility] Agency Contacted: 07/19/19 Time HH Agency Contacted: [phone] Representative spoke with at [facility] Agency: Kandee Keen   Social Determinants of Health (SDOH) Interventions    Readmission Risk Interventions No flowsheet data found.

## 2019-07-19 NOTE — Discharge Summary (Signed)
Physician Discharge Summary  Frederick Gomez EFE:[id number] DOB: [birth year] DOA: 07/12/2019  PCP: Ninfa Meeker, FNP  Admit date: 07/12/2019 Discharge date: 07/19/2019  Admitted From: Home Disposition: Home  Recommendations for Outpatient Follow-up:  1. Follow up with PCP in 1-2 weeks 2. Albuterol as needed for wheezing/shortness of breath 3. Continue Decadron 6 mg p.o. daily for 4 additional days 4. Recommend outpatient sleep study, suspect underlying OSA  Home Health: Yes, PT/OT, social work Equipment/Devices: 3 and 1 bedside commode, oxygen, 3-4 L with exertion  Discharge Condition: Stable CODE STATUS: Full code Diet recommendation: Heart healthy, consistent carbohydrate diet  History of present illness:  Frederick Gomez is a 59 y.o.male with past medical history remarkable for obesity, OSA noncompliant with CPAP, hypertension, TIA, DM type II who was diagnosed with COVID-19 pneumonia about 8 days ago who presented from [facility] with progressive shortness of breath and was found to be hypoxic with an SPO2 of 78% on room air. He was also noted to be tachypneic breathing between 35 and 50 breaths/min. He was admitted directly to [facility] for further care.   Hospital course:  Acute hypoxic respiratory failure secondary to acute Covid-19 viral pneumonia during the ongoing 2020 Covid 19 Pandemic - POA Patient presenting from [facility] infusion clinic with tachypnea and was found to be hypoxic oxygenating 78% on room air. Chest x-ray with interval development of diffuse airspace opacities throughout both lungs consistent with multifocal pneumonia.  D-dimer 1.52 on admission with a CRP of 25.2.  Patient was given Actemra, received 2 doses on 07/12/2019 and 07/13/2019.  Patient completed 5-day course of remdesivir on 07/17/2019.  Patient was initially started on IV Solu-Medrol which was tapered down to Decadron 6 mg p.o. daily at time of discharge.  Patient's inflammatory markers improved and he was  titrated off of supplemental oxygen at rest; although required oxygen with exertion/ambulation between 3-4 L/min.  Will discharge home with home oxygen, PT/OT and social work.  Morbid obesity Obstructive sleep apnea Body mass index is 35.84 kg/m. Discussed with patient needs for aggressive weight loss measures and lifestyle changes as this complicates all facets of care. Also noncompliant with his home CPAP, counseled on need for use.  Recommend follow-up with PCP for repeat sleep study versus counseling on utilizing home CPAP following discharge.  Essential hypertension: BP 124/77, well controlled. Continue amlodipine 5 mg p.o. daily and Bystolic 20 mg p.o. daily  Dyslipidemia: Currently not on statin due to allergy, follow-up with PCP.  History of TIA: Continue Plavix; not on statin due to allergy.  Type 2 diabetes mellitus A1c 8.6, not optimally controlled. On Jardiance and Metformin outpatient.  Follow-up with PCP for further guidance/adjustments.  Discharge Diagnoses:  Active Problems:   COVID-19 virus infection    Discharge Instructions  Discharge Instructions    Call MD for:  difficulty breathing, headache or visual disturbances   Complete by: As directed    Call MD for:  extreme fatigue   Complete by: As directed    Call MD for:  persistant dizziness or light-headedness   Complete by: As directed    Call MD for:  persistant nausea and vomiting   Complete by: As directed    Call MD for:  severe uncontrolled pain   Complete by: As directed    Call MD for:  temperature >100.4   Complete by: As directed    Diet - low sodium heart healthy   Complete by: As directed    Increase activity  slowly   Complete by: As directed      Allergies as of 07/19/2019      Reactions   Hydrochlorothiazide Other (See Comments)   Unknown reaction type   Niaspan [niacin] Other (See Comments)   Unknown reaction type   Pravastatin Nausea Only   Simvastatin Other (See Comments)    Unknown reaction type   Latex Rash      Medication List    TAKE these medications   albuterol 108 (90 Base) MCG/ACT inhaler Commonly known as: VENTOLIN HFA Inhale 2 puffs into the lungs every 6 (six) hours as needed for wheezing or shortness of breath.   amLODipine 5 MG tablet Commonly known as: NORVASC Take 5 mg by mouth daily.   clopidogrel 75 MG tablet Commonly known as: PLAVIX Take 75 mg by mouth daily.   dexamethasone 6 MG tablet Commonly known as: DECADRON Take 1 tablet (6 mg total) by mouth 2 (two) times daily with a meal for 4 days. Start taking on: July 20, 2019   furosemide 80 MG tablet Commonly known as: LASIX Take 80 mg by mouth daily.   lovastatin 20 MG tablet Commonly known as: MEVACOR Take 20 mg by mouth at bedtime.   metFORMIN 500 MG tablet Commonly known as: GLUCOPHAGE Take 1,000 mg by mouth at bedtime.   polycarbophil 625 MG tablet Commonly known as: FIBERCON Take 625 mg by mouth daily as needed (constipation.).   primidone 250 MG tablet Commonly known as: MYSOLINE Take 250 mg by mouth daily as needed (tremor).            Durable Medical Equipment  (From admission, onward)         Start     Ordered   07/17/19 1430  For home use only DME oxygen  Once    Question Answer Comment  Length of Need 6 Months   Mode or (Route) Nasal cannula   Liters per Minute 4   Frequency Continuous (stationary and portable oxygen unit needed)   Oxygen delivery system Gas      07/17/19 1429   07/17/19 1428  For home use only DME 3 n 1  Once     07/17/19 1429         Follow-up Information    Ninfa Meeker, FNP. Schedule an appointment as soon as possible for a visit in 1 week(s).   Specialty: Family Medicine Contact information: [address] [place] [address] [phone]        Thomasene Ripple, DO .   Specialty: Cardiology Contact information: [address] [place] [address] [phone]          Allergies   Allergen Reactions  . Hydrochlorothiazide Other (See Comments)    Unknown reaction type  . Niaspan [Niacin] Other (See Comments)    Unknown reaction type   . Pravastatin Nausea Only  . Simvastatin Other (See Comments)    Unknown reaction type   . Latex Rash    Consultations:  None   Procedures/Studies: DG CHEST PORT 1 VIEW  Result Date: 07/15/2019 CLINICAL DATA:  Dyspnea, hypertension, COVID-19 EXAM: PORTABLE CHEST 1 VIEW COMPARISON:  Portable exam 0619 hours compared to 07/12/2019 FINDINGS: Enlargement of cardiac silhouette. Diffuse BILATERAL airspace infiltrates consistent with multifocal pneumonia, perhaps minimally improved on RIGHT since prior exam. No pleural effusion or pneumothorax. Osseous structures stable. IMPRESSION: Diffuse BILATERAL pulmonary infiltrates consistent with multifocal pneumonia, perhaps minimally improved on RIGHT. Electronically Signed   By: Angelyn Punt.D.  On: 07/15/2019 08:33   DG Chest Port 1 View  Result Date: 07/12/2019 CLINICAL DATA:  Shortness of breath. EXAM: PORTABLE CHEST 1 VIEW COMPARISON:  July 07, 2019. FINDINGS: Stable cardiomegaly. No pneumothorax or pleural effusion is noted. Interval development of diffuse airspace opacities are noted throughout both lungs most consistent with multifocal pneumonia. Bony thorax is unremarkable. IMPRESSION: Interval development of diffuse airspace opacities throughout both lungs most consistent with multifocal pneumonia. Electronically Signed   By: Lupita Raider M.D.   On: 07/12/2019 15:45      Subjective: Patient seen and examined bedside, eating breakfast.  Excited for discharge home today.  No specific complaints this morning.  Denies headache, no chest pain, no palpitations, no shortness of breath, no nausea/vomiting/diarrhea, no cough/congestion, no abdominal pain, no weakness, no fatigue, no paresthesias.  No acute events overnight per nursing staff.  Discharge Exam: Vitals:   07/19/19 0449  07/19/19 0807  BP: 136/73 (!) 145/80  Pulse: (!) 57 (!) 51  Resp: 18 20  Temp: 97.6 F (36.4 C) (!) 97.3 F (36.3 C)  SpO2: 94%    Vitals:   07/19/19 0000 07/19/19 0022 07/19/19 0449 07/19/19 0807  BP:  136/69 136/73 (!) 145/80  Pulse:  (!) 57 (!) 57 (!) 51  Resp:  (!) 23 18 20   Temp: 97.8 F (36.6 C)  97.6 F (36.4 C) (!) 97.3 F (36.3 C)  TempSrc: Oral  Oral Oral  SpO2:  93% 94%   Weight:      Height:        General: Pt is alert, awake, not in acute distress Cardiovascular: RRR, S1/S2 +, no rubs, no gallops Respiratory: CTA bilaterally, no wheezing, no rhonchi, on room air at rest, but requires 3-4 L with exertion/ambulation Abdominal: Soft, NT, ND, bowel sounds + Extremities: no edema, no cyanosis    The results of significant diagnostics from this hospitalization (including imaging, microbiology, ancillary and laboratory) are listed below for reference.     Microbiology: Recent Results (from the past 240 hour(s))  Culture, blood (routine x 2)     Status: None   Collection Time: 07/12/19  3:52 PM   Specimen: BLOOD  Result Value Ref Range Status   Specimen Description   Final    BLOOD RIGHT ARM Performed at [facility], [address] [place], [place]    Special Requests   Final    BOTTLES DRAWN AEROBIC AND ANAEROBIC Blood Culture adequate volume Performed at [facility], [address], [place], [place]    Culture   Final    NO GROWTH 5 DAYS Performed at [facility], [address] [place], [place]    Report Status 07/17/2019 FINAL  Final  Culture, blood (routine x 2)     Status: None   Collection Time: 07/12/19  4:00 PM   Specimen: BLOOD  Result Value Ref Range Status   Specimen Description   Final    BLOOD RIGHT ARM Performed at [facility], [address] [place], [place]    Special Requests   Final    BOTTLES DRAWN AEROBIC ONLY Blood  Culture adequate volume Performed at [facility], [address], [place], [place]    Culture   Final    NO GROWTH 5 DAYS Performed at [facility], [address] [place], [place]    Report Status 07/17/2019 FINAL  Final  Labs: BNP (last 3 results) Recent Labs    07/14/19 0230 07/15/19 0132 07/16/19 0035  BNP 115.8* 131.7* 129.3*   Basic Metabolic Panel: Recent Labs  Lab 07/13/19 0210 07/13/19 0210 07/14/19 0230 07/14/19 0230 07/15/19 0132 07/16/19 0035 07/17/19 0255 07/18/19 0350 07/19/19 0325  NA 137   < > 135   < > 136 133* 135 136 137  K 4.4   < > 3.8   < > 3.6 3.8 3.6 3.3* 3.5  CL 98   < > 96*   < > 98 91* 93* 92* 96*  CO2 27   < > 26   < > 28 30 31 31 28   GLUCOSE 252*   < > 260*   < > 164* 243* 148* 85 79  BUN 20   < > 29*   < > 31* 31* 30* 31* 30*  CREATININE 0.98   < > 1.11   < > 1.02 1.19 1.18 1.21 1.00  CALCIUM 8.6*   < > 8.5*   < > 8.2* 8.1* 8.3* 8.5* 8.4*  MG 2.4  --  2.2  --  2.3 2.5* 2.4  --   --    < > = values in this interval not displayed.   Liver Function Tests: Recent Labs  Lab 07/12/19 1717 07/13/19 0210 07/14/19 0230 07/15/19 0132 07/16/19 0035  AST 31 20 15 22  35  ALT 20 18 18 22  34  ALKPHOS 85 82 83 82 82  BILITOT 1.1 0.6 0.4 0.5 0.8  PROT 7.0 7.0 6.6 6.3* 6.5  ALBUMIN 3.0* 2.8* 2.6* 2.5* 2.9*   No results for input(s): LIPASE, AMYLASE in the last 168 hours. No results for input(s): AMMONIA in the last 168 hours. CBC: Recent Labs  Lab 07/13/19 0210 07/14/19 0230 07/15/19 0132 07/16/19 0035 07/17/19 0255  WBC 6.0 14.9* 12.9* 11.4* 11.5*  NEUTROABS 5.8 13.8* 11.9* 10.1*  --   HGB 14.7 13.8 13.9 14.2 15.0  HCT 43.8 41.5 41.3 43.6 45.9  MCV 85.4 84.9 84.8 85.8 86.6  PLT 267 316 333 317 305   Cardiac Enzymes: No results for input(s): CKTOTAL, CKMB, CKMBINDEX, TROPONINI in the last 168 hours. BNP: Invalid input(s): POCBNP CBG: Recent Labs  Lab 07/17/19 2156  07/18/19 0727 07/18/19 1204 07/18/19 1654 07/18/19 2012  GLUCAP 118* 113* 122* 95 243*   D-Dimer Recent Labs    07/18/19 0350 07/19/19 0325  DDIMER 1.33* 1.36*   Hgb A1c No results for input(s): HGBA1C in the last 72 hours. Lipid Profile No results for input(s): CHOL, HDL, LDLCALC, TRIG, CHOLHDL, LDLDIRECT in the last 72 hours. Thyroid function studies No results for input(s): TSH, T4TOTAL, T3FREE, THYROIDAB in the last 72 hours.  Invalid input(s): FREET3 Anemia work up No results for input(s): VITAMINB12, FOLATE, FERRITIN, TIBC, IRON, RETICCTPCT in the last 72 hours. Urinalysis No results found for: COLORURINE, APPEARANCEUR, LABSPEC, PHURINE, GLUCOSEU, HGBUR, BILIRUBINUR, KETONESUR, PROTEINUR, UROBILINOGEN, NITRITE, LEUKOCYTESUR Sepsis Labs Invalid input(s): PROCALCITONIN,  WBC,  LACTICIDVEN Microbiology Recent Results (from the past 240 hour(s))  Culture, blood (routine x 2)     Status: None   Collection Time: 07/12/19  3:52 PM   Specimen: BLOOD  Result Value Ref Range Status   Specimen Description   Final    BLOOD RIGHT ARM Performed at [facility], [address], [place], [place]    Special Requests   Final    BOTTLES DRAWN AEROBIC AND ANAEROBIC Blood Culture adequate volume Performed at [facility], [place] [address] [place], [place] [address]    Culture   Final    NO GROWTH 5 DAYS Performed at [facility], [place] [address] [place], [place] [address]    Report Status 07/17/2019 FINAL  Final  Culture, blood (routine x 2)     Status: None   Collection Time: 07/12/19  4:00 PM   Specimen: BLOOD  Result Value Ref Range Status   Specimen Description   Final    BLOOD RIGHT ARM Performed at [facility] [address] [place], [place] [address]    Special Requests   Final    BOTTLES DRAWN AEROBIC ONLY Blood Culture adequate volume Performed at [facility]  [address] [place], [place] [address]    Culture   Final    NO GROWTH 5 DAYS Performed at [facility], [place] [address], [place], [place] [address]    Report Status 07/17/2019 FINAL  Final     Time coordinating discharge: Over 30 minutes  SIGNED:   Donnamarie Poag [place] DO  [facility] 07/19/2019, 10:30 AM

## 2019-07-19 NOTE — Discharge Instructions (Signed)
10 Things You Can Do to Manage Your COVID-19 Symptoms at Home If you have possible or confirmed COVID-19: 1. Stay home from work and school. And stay away from other public places. If you must go out, avoid using any kind of public transportation, ridesharing, or taxis. 2. Monitor your symptoms carefully. If your symptoms get worse, call your healthcare provider immediately. 3. Get rest and stay hydrated. 4. If you have a medical appointment, call the healthcare provider ahead of time and tell them that you have or may have COVID-19. 5. For medical emergencies, call 911 and notify the dispatch personnel that you have or may have COVID-19. 6. Cover your cough and sneezes with a tissue or use the inside of your elbow. 7. Wash your hands often with soap and water for at least 20 seconds or clean your hands with an alcohol-based hand sanitizer that contains at least 60% alcohol. 8. As much as possible, stay in a specific room and away from other people in your home. Also, you should use a separate bathroom, if available. If you need to be around other people in or outside of the home, wear a mask. 9. Avoid sharing personal items with other people in your household, like dishes, towels, and bedding. 10. Clean all surfaces that are touched often, like counters, tabletops, and doorknobs. Use household cleaning sprays or wipes according to the label instructions. cdc.gov/coronavirus 12/08/2018 This information is not intended to replace advice given to you by your health care provider. Make sure you discuss any questions you have with your health care provider. Document Revised: 05/12/2019 Document Reviewed: 05/12/2019 Elsevier Patient Education  2020 [employer] Inc.   COVID-19 COVID-19 is a respiratory infection that is caused by a virus called severe acute respiratory syndrome coronavirus 2 (SARS-CoV-2). The disease is also known as coronavirus disease or novel coronavirus. In some people, the virus may  not cause any symptoms. In others, it may cause a serious infection. The infection can get worse quickly and can lead to complications, such as:  Pneumonia, or infection of the lungs.  Acute respiratory distress syndrome or ARDS. This is a condition in which fluid build-up in the lungs prevents the lungs from filling with air and passing oxygen into the blood.  Acute respiratory failure. This is a condition in which there is not enough oxygen passing from the lungs to the body or when carbon dioxide is not passing from the lungs out of the body.  Sepsis or septic shock. This is a serious bodily reaction to an infection.  Blood clotting problems.  Secondary infections due to bacteria or fungus.  Organ failure. This is when your body's organs stop working. The virus that causes COVID-19 is contagious. This means that it can spread from person to person through droplets from coughs and sneezes (respiratory secretions). What are the causes? This illness is caused by a virus. You may catch the virus by:  Breathing in droplets from an infected person. Droplets can be spread by a person breathing, speaking, singing, coughing, or sneezing.  Touching something, like a table or a doorknob, that was exposed to the virus (contaminated) and then touching your mouth, nose, or eyes. What increases the risk? Risk for infection You are more likely to be infected with this virus if you:  Are within 6 feet (2 meters) of a person with COVID-19.  Provide care for or live with a person who is infected with COVID-19.  Spend time in crowded indoor spaces or   live in shared housing. Risk for serious illness You are more likely to become seriously ill from the virus if you:  Are 50 years of age or older. The higher your age, the more you are at risk for serious illness.  Live in a nursing home or long-term care facility.  Have cancer.  Have a long-term (chronic) disease such as: ? Chronic lung disease,  including chronic obstructive pulmonary disease or asthma. ? A long-term disease that lowers your body's ability to fight infection (immunocompromised). ? Heart disease, including heart failure, a condition in which the arteries that lead to the heart become narrow or blocked (coronary artery disease), a disease which makes the heart muscle thick, weak, or stiff (cardiomyopathy). ? Diabetes. ? Chronic kidney disease. ? Sickle cell disease, a condition in which red blood cells have an abnormal "sickle" shape. ? Liver disease.  Are obese. What are the signs or symptoms? Symptoms of this condition can range from mild to severe. Symptoms may appear any time from 2 to 14 days after being exposed to the virus. They include:  A fever or chills.  A cough.  Difficulty breathing.  Headaches, body aches, or muscle aches.  Runny or stuffy (congested) nose.  A sore throat.  New loss of taste or smell. Some people may also have stomach problems, such as nausea, vomiting, or diarrhea. Other people may not have any symptoms of COVID-19. How is this diagnosed? This condition may be diagnosed based on:  Your signs and symptoms, especially if: ? You live in an area with a COVID-19 outbreak. ? You recently traveled to or from an area where the virus is common. ? You provide care for or live with a person who was diagnosed with COVID-19. ? You were exposed to a person who was diagnosed with COVID-19.  A physical exam.  Lab tests, which may include: ? Taking a sample of fluid from the back of your nose and throat (nasopharyngeal fluid), your nose, or your throat using a swab. ? A sample of mucus from your lungs (sputum). ? Blood tests.  Imaging tests, which may include, X-rays, CT scan, or ultrasound. How is this treated? At present, there is no medicine to treat COVID-19. Medicines that treat other diseases are being used on a trial basis to see if they are effective against COVID-19. Your  health care provider will talk with you about ways to treat your symptoms. For most people, the infection is mild and can be managed at home with rest, fluids, and over-the-counter medicines. Treatment for a serious infection usually takes places in a hospital intensive care unit (ICU). It may include one or more of the following treatments. These treatments are given until your symptoms improve.  Receiving fluids and medicines through an IV.  Supplemental oxygen. Extra oxygen is given through a tube in the nose, a face mask, or a hood.  Positioning you to lie on your stomach (prone position). This makes it easier for oxygen to get into the lungs.  Continuous positive airway pressure (CPAP) or bi-level positive airway pressure (BPAP) machine. This treatment uses mild air pressure to keep the airways open. A tube that is connected to a motor delivers oxygen to the body.  Ventilator. This treatment moves air into and out of the lungs by using a tube that is placed in your windpipe.  Tracheostomy. This is a procedure to create a hole in the neck so that a breathing tube can be inserted.  Extracorporeal membrane   oxygenation (ECMO). This procedure gives the lungs a chance to recover by taking over the functions of the heart and lungs. It supplies oxygen to the body and removes carbon dioxide. Follow these instructions at home: Lifestyle  If you are sick, stay home except to get medical care. Your health care provider will tell you how long to stay home. Call your health care provider before you go for medical care.  Rest at home as told by your health care provider.  Do not use any products that contain nicotine or tobacco, such as cigarettes, e-cigarettes, and chewing tobacco. If you need help quitting, ask your health care provider.  Return to your normal activities as told by your health care provider. Ask your health care provider what activities are safe for you. General  instructions  Take over-the-counter and prescription medicines only as told by your health care provider.  Drink enough fluid to keep your urine pale yellow.  Keep all follow-up visits as told by your health care provider. This is important. How is this prevented?  There is no vaccine to help prevent COVID-19 infection. However, there are steps you can take to protect yourself and others from this virus. To protect yourself:   Do not travel to areas where COVID-19 is a risk. The areas where COVID-19 is reported change often. To identify high-risk areas and travel restrictions, check the CDC travel website: wwwnc.cdc.gov/travel/notices  If you live in, or must travel to, an area where COVID-19 is a risk, take precautions to avoid infection. ? Stay away from people who are sick. ? Wash your hands often with soap and water for 20 seconds. If soap and water are not available, use an alcohol-based hand sanitizer. ? Avoid touching your mouth, face, eyes, or nose. ? Avoid going out in public, follow guidance from your state and local health authorities. ? If you must go out in public, wear a cloth face covering or face mask. Make sure your mask covers your nose and mouth. ? Avoid crowded indoor spaces. Stay at least 6 feet (2 meters) away from others. ? Disinfect objects and surfaces that are frequently touched every day. This may include:  Counters and tables.  Doorknobs and light switches.  Sinks and faucets.  Electronics, such as phones, remote controls, keyboards, computers, and tablets. To protect others: If you have symptoms of COVID-19, take steps to prevent the virus from spreading to others.  If you think you have a COVID-19 infection, contact your health care provider right away. Tell your health care team that you think you may have a COVID-19 infection.  Stay home. Leave your house only to seek medical care. Do not use public transport.  Do not travel while you are  sick.  Wash your hands often with soap and water for 20 seconds. If soap and water are not available, use alcohol-based hand sanitizer.  Stay away from other members of your household. Let healthy household members care for children and pets, if possible. If you have to care for children or pets, wash your hands often and wear a mask. If possible, stay in your own room, separate from others. Use a different bathroom.  Make sure that all people in your household wash their hands well and often.  Cough or sneeze into a tissue or your sleeve or elbow. Do not cough or sneeze into your hand or into the air.  Wear a cloth face covering or face mask. Make sure your mask covers your nose   and mouth. Where to find more information  Centers for Disease Control and Prevention: PurpleGadgets.be  World Health Organization: https://www.castaneda.info/ Contact a health care provider if:  You live in or have traveled to an area where COVID-19 is a risk and you have symptoms of the infection.  You have had contact with someone who has COVID-19 and you have symptoms of the infection. Get help right away if:  You have trouble breathing.  You have pain or pressure in your chest.  You have confusion.  You have bluish lips and fingernails.  You have difficulty waking from sleep.  You have symptoms that get worse. These symptoms may represent a serious problem that is an emergency. Do not wait to see if the symptoms will go away. Get medical help right away. Call your local emergency services (911 in the [place] Do not drive yourself to the hospital. Let the emergency medical personnel know if you think you have COVID-19. Summary  COVID-19 is a respiratory infection that is caused by a virus. It is also known as coronavirus disease or novel coronavirus. It can cause serious infections, such as pneumonia, acute respiratory distress syndrome, acute respiratory failure,  or sepsis.  The virus that causes COVID-19 is contagious. This means that it can spread from person to person through droplets from breathing, speaking, singing, coughing, or sneezing.  You are more likely to develop a serious illness if you are 56 years of age or older, have a weak immune system, live in a nursing home, or have chronic disease.  There is no medicine to treat COVID-19. Your health care provider will talk with you about ways to treat your symptoms.  Take steps to protect yourself and others from infection. Wash your hands often and disinfect objects and surfaces that are frequently touched every day. Stay away from people who are sick and wear a mask if you are sick. This information is not intended to replace advice given to you by your health care provider. Make sure you discuss any questions you have with your health care provider. Document Revised: 03/25/2019 Document Reviewed: 07/01/2018 Elsevier Patient Education  2020 [place].  COVID-19: How to Protect Yourself and Others Know how it spreads  There is currently no vaccine to prevent coronavirus disease 2019 (COVID-19).  The best way to prevent illness is to avoid being exposed to this virus.  The virus is thought to spread mainly from person-to-person. ? Between people who are in close contact with one another (within about 6 feet). ? Through respiratory droplets produced when an infected person coughs, sneezes or talks. ? These droplets can land in the mouths or noses of people who are nearby or possibly be inhaled into the lungs. ? COVID-19 may be spread by people who are not showing symptoms. Everyone should Clean your hands often  Wash your hands often with soap and water for at least 20 seconds especially after you have been in a public place, or after blowing your nose, coughing, or sneezing.  If soap and water are not readily available, use a hand sanitizer that contains at least 60% alcohol. Cover  all surfaces of your hands and rub them together until they feel dry.  Avoid touching your eyes, nose, and mouth with unwashed hands. Avoid close contact  Limit contact with others as much as possible.  Avoid close contact with people who are sick.  Put distance between yourself and other people. ? Remember that some people without symptoms may be  able to spread virus. ? This is especially important for people who are at higher risk of getting very [employer].it Cover your mouth and nose with a mask when around others  You could spread COVID-19 to others even if you do not feel sick.  Everyone should wear a mask in public settings and when around people not living in their household, especially when social distancing is difficult to maintain. ? Masks should not be placed on young children under age 19, anyone who has trouble breathing, or is unconscious, incapacitated or otherwise unable to remove the mask without assistance.  The mask is meant to protect other people in case you are infected.  Do NOT use a facemask meant for a Research scientist (physical sciences).  Continue to keep about 6 feet between yourself and others. The mask is not a substitute for social distancing. Cover coughs and sneezes  Always cover your mouth and nose with a tissue when you cough or sneeze or use the inside of your elbow.  Throw used tissues in the trash.  Immediately wash your hands with soap and water for at least 20 seconds. If soap and water are not readily available, clean your hands with a hand sanitizer that contains at least 60% alcohol. Clean and disinfect  Clean AND disinfect frequently touched surfaces daily. This includes tables, doorknobs, light switches, countertops, handles, desks, phones, keyboards, toilets, faucets, and sinks. ktimeonline.com  If surfaces are  dirty, clean them: Use detergent or soap and water prior to disinfection.  Then, use a household disinfectant. You can see a list of EPA-registered household disinfectants here. [place]Flowers.co.uk 02/09/2019 This information is not intended to replace advice given to you by your health care provider. Make sure you discuss any questions you have with your health care provider. Document Revised: 02/17/2019 Document Reviewed: 12/16/2018 Elsevier Patient Education  2020 Elsevier Inc.    Person Under Monitoring Name: Frederick Gomez  Location: [address] [place] [address]   Infection Prevention Recommendations for Individuals Confirmed to have, or Being Evaluated for, 2019 Novel Coronavirus (COVID-19) Infection Who Receive Care at Home  Individuals who are confirmed to have, or are being evaluated for, COVID-19 should follow the prevention steps below until a healthcare provider or local or state health department says they can return to normal activities.  Stay home except to get medical care You should restrict activities outside your home, except for getting medical care. Do not go to work, school, or public areas, and do not use public transportation or taxis.  Call ahead before visiting your doctor Before your medical appointment, call the healthcare provider and tell them that you have, or are being evaluated for, COVID-19 infection. This will help the healthcare provider's office take steps to keep other people from getting infected. Ask your healthcare provider to call the local or state health department.  Monitor your symptoms Seek prompt medical attention if your illness is worsening (e.g., difficulty breathing). Before going to your medical appointment, call the healthcare provider and tell them that you have, or are being evaluated for, COVID-19 infection. Ask your healthcare provider to call the local or state health department.  Wear a facemask You should wear  a facemask that covers your nose and mouth when you are in the same room with other people and when you visit a healthcare provider. People who live with or visit you should also wear a facemask while they are in the same room with you.  Separate yourself from other people in  your home As much as possible, you should stay in a different room from other people in your home. Also, you should use a separate bathroom, if available.  Avoid sharing household items You should not share dishes, drinking glasses, cups, eating utensils, towels, bedding, or other items with other people in your home. After using these items, you should wash them thoroughly with soap and water.  Cover your coughs and sneezes Cover your mouth and nose with a tissue when you cough or sneeze, or you can cough or sneeze into your sleeve. Throw used tissues in a lined trash can, and immediately wash your hands with soap and water for at least 20 seconds or use an alcohol-based hand rub.  Wash your [employer] your hands often and thoroughly with soap and water for at least 20 seconds. You can use an alcohol-based hand sanitizer if soap and water are not available and if your hands are not visibly dirty. Avoid touching your eyes, nose, and mouth with unwashed hands.   Prevention Steps for Caregivers and Household Members of Individuals Confirmed to have, or Being Evaluated for, COVID-19 Infection Being Cared for in the Home  If you live with, or provide care at home for, a person confirmed to have, or being evaluated for, COVID-19 infection please follow these guidelines to prevent infection:  Follow healthcare provider's instructions Make sure that you understand and can help the patient follow any healthcare provider instructions for all care.  Provide for the patient's basic needs You should help the patient with basic needs in the home and provide support for getting groceries, prescriptions, and other personal  needs.  Monitor the patient's symptoms If they are getting sicker, call his or her medical provider and tell them that the patient has, or is being evaluated for, COVID-19 infection. This will help the healthcare provider's office take steps to keep other people from getting infected. Ask the healthcare provider to call the local or state health department.  Limit the number of people who have contact with the patient  If possible, have only one caregiver for the patient.  Other household members should stay in another home or place of residence. If this is not possible, they should stay  in another room, or be separated from the patient as much as possible. Use a separate bathroom, if available.  Restrict visitors who do not have an essential need to be in the home.  Keep older adults, very young children, and other sick people away from the patient Keep older adults, very young children, and those who have compromised immune systems or chronic health conditions away from the patient. This includes people with chronic heart, lung, or kidney conditions, diabetes, and cancer.  Ensure good ventilation Make sure that shared spaces in the home have good air flow, such as from an air conditioner or an opened window, weather permitting.  Wash your hands often  Wash your hands often and thoroughly with soap and water for at least 20 seconds. You can use an alcohol based hand sanitizer if soap and water are not available and if your hands are not visibly dirty.  Avoid touching your eyes, nose, and mouth with unwashed hands.  Use disposable paper towels to dry your hands. If not available, use dedicated cloth towels and replace them when they become wet.  Wear a facemask and gloves  Wear a disposable facemask at all times in the room and gloves when you touch or have contact with the  patient's blood, body fluids, and/or secretions or excretions, such as sweat, saliva, sputum, nasal mucus,  vomit, urine, or feces.  Ensure the mask fits over your nose and mouth tightly, and do not touch it during use.  Throw out disposable facemasks and gloves after using them. Do not reuse.  Wash your hands immediately after removing your facemask and gloves.  If your personal clothing becomes contaminated, carefully remove clothing and launder. Wash your hands after handling contaminated clothing.  Place all used disposable facemasks, gloves, and other waste in a lined container before disposing them with other household waste.  Remove gloves and wash your hands immediately after handling these items.  Do not share dishes, glasses, or other household items with the patient  Avoid sharing household items. You should not share dishes, drinking glasses, cups, eating utensils, towels, bedding, or other items with a patient who is confirmed to have, or being evaluated for, COVID-19 infection.  After the person uses these items, you should wash them thoroughly with soap and water.  Wash laundry thoroughly  Immediately remove and wash clothes or bedding that have blood, body fluids, and/or secretions or excretions, such as sweat, saliva, sputum, nasal mucus, vomit, urine, or feces, on them.  Wear gloves when handling laundry from the patient.  Read and follow directions on labels of laundry or clothing items and detergent. In general, wash and dry with the warmest temperatures recommended on the label.  Clean all areas the individual has used often  Clean all touchable surfaces, such as counters, tabletops, doorknobs, bathroom fixtures, toilets, phones, keyboards, tablets, and bedside tables, every day. Also, clean any surfaces that may have blood, body fluids, and/or secretions or excretions on them.  Wear gloves when cleaning surfaces the patient has come in contact with.  Use a diluted bleach solution (e.g., dilute bleach with 1 part bleach and 10 parts water) or a household disinfectant  with a label that says EPA-registered for coronaviruses. To make a bleach solution at home, add 1 tablespoon of bleach to 1 quart (4 cups) of water. For a larger supply, add  cup of bleach to 1 gallon (16 cups) of water.  Read labels of cleaning products and follow recommendations provided on product labels. Labels contain instructions for safe and effective use of the cleaning product including precautions you should take when applying the product, such as wearing gloves or eye protection and making sure you have good ventilation during use of the product.  Remove gloves and wash hands immediately after cleaning.  Monitor yourself for signs and symptoms of illness Caregivers and household members are considered close contacts, should monitor their health, and will be asked to limit movement outside of the home to the extent possible. Follow the monitoring steps for close contacts listed on the symptom monitoring form.   ? If you have additional questions, contact your local health department or call the epidemiologist on call at [phone] (available 24/7). ? This guidance is subject to change. For the most up-to-date guidance from [facility], please refer to their website: [email or web address]

## 2019-08-25 ENCOUNTER — Encounter: Payer: Self-pay | Admitting: Cardiology

## 2019-08-25 ENCOUNTER — Ambulatory Visit: Payer: BC Managed Care – PPO | Admitting: Cardiology

## 2019-08-25 ENCOUNTER — Other Ambulatory Visit: Payer: Self-pay

## 2019-08-25 VITALS — BP 150/96 | HR 84 | Ht 68.5 in | Wt 241.0 lb

## 2019-08-25 DIAGNOSIS — G459 Transient cerebral ischemic attack, unspecified: Secondary | ICD-10-CM

## 2019-08-25 DIAGNOSIS — I712 Thoracic aortic aneurysm, without rupture: Secondary | ICD-10-CM

## 2019-08-25 DIAGNOSIS — I7121 Aneurysm of the ascending aorta, without rupture: Secondary | ICD-10-CM

## 2019-08-25 DIAGNOSIS — E119 Type 2 diabetes mellitus without complications: Secondary | ICD-10-CM

## 2019-08-25 DIAGNOSIS — I10 Essential (primary) hypertension: Secondary | ICD-10-CM | POA: Diagnosis not present

## 2019-08-25 DIAGNOSIS — G4733 Obstructive sleep apnea (adult) (pediatric): Secondary | ICD-10-CM | POA: Diagnosis not present

## 2019-08-25 DIAGNOSIS — E782 Mixed hyperlipidemia: Secondary | ICD-10-CM

## 2019-08-25 DIAGNOSIS — E669 Obesity, unspecified: Secondary | ICD-10-CM

## 2019-08-25 DIAGNOSIS — U071 COVID-19: Secondary | ICD-10-CM

## 2019-08-25 LAB — BASIC METABOLIC PANEL
BUN/Creatinine Ratio: 14 (ref 9–20)
BUN: 12 mg/dL (ref 6–24)
CO2: 24 mmol/L (ref 20–29)
Calcium: 9.4 mg/dL (ref 8.7–10.2)
Chloride: 102 mmol/L (ref 96–106)
Creatinine, Ser: 0.87 mg/dL (ref 0.76–1.27)
GFR calc Af Amer: 112 mL/min/{1.73_m2} (ref >59)
GFR calc non Af Amer: 96 mL/min/{1.73_m2} (ref >59)
Glucose: 116 mg/dL — ABNORMAL HIGH (ref 65–99)
Potassium: 4.3 mmol/L (ref 3.5–5.2)
Sodium: 140 mmol/L (ref 134–144)

## 2019-08-25 LAB — MAGNESIUM: Magnesium: 2 mg/dL (ref 1.6–2.3)

## 2019-08-25 MED ORDER — FUROSEMIDE 40 MG PO TABS: 40.0000 mg | ORAL_TABLET | Freq: Two times a day (BID) | ORAL | 1 refills | Status: DC

## 2019-08-25 MED ORDER — NEBIVOLOL HCL 20 MG PO TABS: 20.0000 mg | ORAL_TABLET | Freq: Every day | ORAL | 1 refills | Status: DC

## 2019-08-25 MED ORDER — POTASSIUM CHLORIDE CRYS ER 20 MEQ PO TBCR: 40.0000 meq | EXTENDED_RELEASE_TABLET | Freq: Every day | ORAL | 1 refills | Status: AC

## 2019-08-25 NOTE — Progress Notes (Signed)
Cardiology Office Note:    Date:  08/25/2019   ID:  Frederick Gomez, DOB 10/21/1960, MRN [id number]  PCP:  Ninfa Meeker, FNP  Cardiologist:  Thomasene Ripple, DO  Electrophysiologist:  None   Referring MD: Vertell Novak*   Chief Complaint  Patient presents with  . Hospitalization Follow-up   History of Present Illness:    Frederick Gomez a 59 y.o.malewith a hx of hx of hypertension, diabetes, hyperlipidemia, OSA and previous TIAs on Plavix, OSA on CPAP, recent history of COVID-19 infection.  Last saw the patient 06/29/2018 at that time he was hypertensive.   Adjustments were made to his medications: He was supposed to be on Bystolic 20 mg daily, Amlodipine 5 mg daily and Lasix 80 mg daily.   In the interim that patient was hospitalized for Covid 19 infection.  He is here today. He has been taking his Amlodipine, but not his bystolic. Lasix was cut back to 40 mg daily.  He tells me that he is back to work and his getting much improved from his Covid infection.   Past Medical History:  Diagnosis Date  . Ascending aortic aneurysm (HCC) 03/30/2019  . COVID-19 virus infection 07/12/2019  . Diabetes (HCC)   . Essential hypertension 03/30/2019  . Hyperlipidemia   . Hypertension   . Mild aortic regurgitation 03/30/2019  . Mild aortic stenosis 03/30/2019  . Mixed hyperlipidemia 03/30/2019  . Obese   . Obesity (BMI 30-39.9) 03/30/2019  . OSA (obstructive sleep apnea) 04/01/2016  . OSA on CPAP   . Seasonal allergies   . TIA (transient ischemic attack)   . Type 2 diabetes mellitus without complication, without long-term current use of insulin (HCC) 03/30/2019    Past Surgical History:  Procedure Laterality Date  . CARDIAC CATHETERIZATION    . HEMORROIDECTOMY    . NOSE SURGERY      Current Medications: Current Meds  Medication Sig  . albuterol (VENTOLIN HFA) 108 (90 Base) MCG/ACT inhaler Inhale 2 puffs into the lungs every 6 (six) hours as needed for wheezing or  shortness of breath.  . clopidogrel (PLAVIX) 75 MG tablet Take 75 mg by mouth daily.   . furosemide (LASIX) 40 MG tablet Take 1 tablet (40 mg total) by mouth 2 (two) times daily.  Marland Kitchen lovastatin (MEVACOR) 20 MG tablet Take 20 mg by mouth at bedtime.   . metFORMIN (GLUCOPHAGE) 850 MG tablet Take 850 mg by mouth 2 (two) times daily.  . polycarbophil (FIBERCON) 625 MG tablet Take 625 mg by mouth daily as needed (constipation.).   Marland Kitchen potassium chloride SA (KLOR-CON) 20 MEQ tablet Take 2 tablets (40 mEq total) by mouth daily.  . primidone (MYSOLINE) 250 MG tablet Take 250 mg by mouth daily as needed (tremor).   . [DISCONTINUED] amLODipine (NORVASC) 5 MG tablet Take 5 mg by mouth daily.   . [DISCONTINUED] furosemide (LASIX) 40 MG tablet Take 40 mg by mouth every morning.  . [DISCONTINUED] potassium chloride SA (KLOR-CON) 20 MEQ tablet Take 40 mEq by mouth daily.     Allergies:   Hydrochlorothiazide, Niaspan [niacin], Pravastatin, Simvastatin, and Latex   Social History   Socioeconomic History  . Marital status: Single    Spouse name: Not on file  . Number of children: Not on file  . Years of education: Not on file  . Highest education level: Not on file  Occupational History  . Not on file  Tobacco Use  . Smoking status: Former Smoker  Years: 3.00    Types: Cigarettes    Quit date: 06/10/1979    Years since quitting: 40.2  . Smokeless tobacco: Never Used  Substance and Sexual Activity  . Alcohol use: Never  . Drug use: Never  . Sexual activity: Not Currently  Other Topics Concern  . Not on file  Social History Narrative  . Not on file   Social Determinants of Health   Financial Resource Strain:   . Difficulty of Paying Living Expenses:   Food Insecurity:   . Worried About Charity fundraiser in the Last Year:   . Arboriculturist in the Last Year:   Transportation Needs:   . Film/video editor (Medical):   Marland Kitchen Lack of Transportation (Non-Medical):   Physical Activity:   .  Days of Exercise per Week:   . Minutes of Exercise per Session:   Stress:   . Feeling of Stress :   Social Connections:   . Frequency of Communication with Friends and Family:   . Frequency of Social Gatherings with Friends and Family:   . Attends Religious Services:   . Active Member of Clubs or Organizations:   . Attends Archivist Meetings:   Marland Kitchen Marital Status:      Family History: The patient's family history includes Diabetes in his mother; Heart attack in his mother; Suicidality in his father.  ROS:   Review of Systems  Constitution: Negative for decreased appetite, fever and weight gain.  HENT: Negative for congestion, ear discharge, hoarse voice and sore throat.   Eyes: Negative for discharge, redness, vision loss in right eye and visual halos.  Cardiovascular: Negative for chest pain, dyspnea on exertion, leg swelling, orthopnea and palpitations.  Respiratory: Negative for cough, hemoptysis, shortness of breath and snoring.   Endocrine: Negative for heat intolerance and polyphagia.  Hematologic/Lymphatic: Negative for bleeding problem. Does not bruise/bleed easily.  Skin: Negative for flushing, nail changes, rash and suspicious lesions.  Musculoskeletal: Negative for arthritis, joint pain, muscle cramps, myalgias, neck pain and stiffness.  Gastrointestinal: Negative for abdominal pain, bowel incontinence, diarrhea and excessive appetite.  Genitourinary: Negative for decreased libido, genital sores and incomplete emptying.  Neurological: Negative for brief paralysis, focal weakness, headaches and loss of balance.  Psychiatric/Behavioral: Negative for altered mental status, depression and suicidal ideas.  Allergic/Immunologic: Negative for HIV exposure and persistent infections.    EKGs/Labs/Other Studies Reviewed:    The following studies were reviewed today:   EKG: None today    Recent Labs: 07/16/2019: ALT 34; B Natriuretic Peptide 129.3 07/17/2019:  Hemoglobin 15.0; Magnesium 2.4; Platelets 305 07/19/2019: BUN 30; Creatinine, Ser 1.00; Potassium 3.5; Sodium 137  Recent Lipid Panel    Component Value Date/Time   CHOL 175 03/29/2019 0858   TRIG 108 03/29/2019 0858   HDL 37 (L) 03/29/2019 0858   CHOLHDL 4.7 03/29/2019 0858   LDLCALC 118 (H) 03/29/2019 0858    Physical Exam:    VS:  BP (!) 150/96 (BP Location: Left Arm, Patient Position: Sitting, Cuff Size: Normal)   Pulse 84   Ht 5' 8.5" (1.74 m)   Wt 241 lb (109.3 kg)   SpO2 96%   BMI 36.11 kg/m     Wt Readings from Last 3 Encounters:  08/25/19 241 lb (109.3 kg)  07/12/19 239 lb 3.2 oz (108.5 kg)  05/02/19 248 lb (112.5 kg)     GEN: Well nourished, well developed in no acute distress HEENT: Normal NECK: No JVD; No carotid  bruits LYMPHATICS: No lymphadenopathy CARDIAC: S1S2 noted,RRR, no murmurs, rubs, gallops RESPIRATORY:  Clear to auscultation without rales, wheezing or rhonchi  ABDOMEN: Soft, non-tender, non-distended, +bowel sounds, no guarding. EXTREMITIES: bilateral leg edema, No cyanosis, no clubbing MUSCULOSKELETAL:  No deformity  SKIN: Warm and dry NEUROLOGIC:  Alert and oriented x 3, non-focal PSYCHIATRIC:  Normal affect, good insight  ASSESSMENT:    1. Essential hypertension   2. TIA (transient ischemic attack)   3. OSA (obstructive sleep apnea)   4. Type 2 diabetes mellitus without complication, without long-term current use of insulin (HCC)   5. Mixed hyperlipidemia   6. COVID-19 virus infection   7. Obesity (BMI 30-39.9)   8. Ascending aortic aneurysm (HCC)    PLAN:     He does have bilateral leg edema, this could be multifactorial- he says that this is really bothersome. I will stop the Amlodipine as this may be contributing, I will increase his Lasix to 40 mg twice daily with potassium supplement.   He is hypertensive today and has not been taking his bystolic - he could not tell me why. I advised him that he will need to resume this  medication. I will send refill to the pharmacy for this.   Hx of covid 19 infection - he is improving.  Blood work will be done today includes bmp, and mag.   OSA - he plans to discuss with his pcp about getting a new machine.   TIA - continue plavix and statin  The patient is in agreement with the above plan. The patient left the office in stable condition.  The patient will follow up in 1 month    Medication Adjustments/Labs and Tests Ordered: Current medicines are reviewed at length with the patient today.  Concerns regarding medicines are outlined above.  Orders Placed This Encounter  Procedures  . Basic metabolic panel  . Magnesium   Meds ordered this encounter  Medications  . furosemide (LASIX) 40 MG tablet    Sig: Take 1 tablet (40 mg total) by mouth 2 (two) times daily.    Dispense:  180 tablet    Refill:  1  . potassium chloride SA (KLOR-CON) 20 MEQ tablet    Sig: Take 2 tablets (40 mEq total) by mouth daily.    Dispense:  180 tablet    Refill:  1  . Nebivolol HCl (BYSTOLIC) 20 MG TABS    Sig: Take 1 tablet (20 mg total) by mouth daily.    Dispense:  90 tablet    Refill:  1    Patient Instructions  Medication Instructions:  Your physician has recommended you make the following change in your medication:   STOP: amlodipine   START: Bystolic 20 mg daily  INCREASE: Lasix to 40 mg twice daily  INCREASE: Potassium to 40 meq daily   *If you need a refill on your cardiac medications before your next appointment, please call your pharmacy*   Lab Work: Your physician recommends that you return for lab work today: bmp, mg   If you have labs (blood work) drawn today and your tests are completely normal, you will receive your results only by: Marland Kitchen MyChart Message (if you have MyChart) OR . A paper copy in the mail If you have any lab test that is abnormal or we need to change your treatment, we will call you to review the  results.   Testing/Procedures: None.    Follow-Up: At [facility], you and your health needs are  our priority.  As part of our continuing mission to provide you with exceptional heart care, we have created designated Provider Care Teams.  These Care Teams include your primary Cardiologist (physician) and Advanced Practice Providers (APPs -  Physician Assistants and Nurse Practitioners) who all work together to provide you with the care you need, when you need it.  We recommend signing up for the patient portal called "MyChart".  Sign up information is provided on this After Visit Summary.  MyChart is used to connect with patients for Virtual Visits (Telemedicine).  Patients are able to view lab/test results, encounter notes, upcoming appointments, etc.  Non-urgent messages can be sent to your provider as well.   To learn more about what you can do with MyChart, go to [email or web address].    Your next appointment:   1 month(s)  The format for your next appointment:   In Person  Provider:   Thomasene Ripple, DO   Other Instructions       Adopting a Healthy Lifestyle.  Know what a healthy weight is for you (roughly BMI <25) and aim to maintain this   Aim for 7+ servings of fruits and vegetables daily   65-80+ fluid ounces of water or unsweet tea for healthy kidneys   Limit to max 1 drink of alcohol per day; avoid smoking/tobacco   Limit animal fats in diet for cholesterol and heart health - choose grass fed whenever available   Avoid highly processed foods, and foods high in saturated/trans fats   Aim for low stress - take time to unwind and care for your mental health   Aim for 150 min of moderate intensity exercise weekly for heart health, and weights twice weekly for bone health   Aim for 7-9 hours of sleep daily   When it comes to diets, agreement about the perfect plan isnt easy to find, even among the experts. Experts at the [facility] [employer]  developed an idea known as the Healthy Eating Plate. Just imagine a plate divided into logical, healthy portions.   The emphasis is on diet quality:   Load up on vegetables and fruits - one-half of your plate: Aim for color and variety, and remember that potatoes dont count.   Go for whole grains - one-quarter of your plate: Whole wheat, barley, wheat berries, quinoa, oats, brown rice, and foods made with them. If you want pasta, go with whole wheat pasta.   Protein power - one-quarter of your plate: Fish, chicken, beans, and nuts are all healthy, versatile protein sources. Limit red meat.   The diet, however, does go beyond the plate, offering a few other suggestions.   Use healthy plant oils, such as olive, canola, soy, corn, sunflower and peanut. Check the labels, and avoid partially hydrogenated oil, which have unhealthy trans fats.   If youre thirsty, drink water. Coffee and tea are good in moderation, but skip sugary drinks and limit milk and dairy products to one or two daily servings.   The type of carbohydrate in the diet is more important than the amount. Some sources of carbohydrates, such as vegetables, fruits, whole grains, and beans-are healthier than others.   Finally, stay active  Signed, Thomasene Ripple, DO  08/25/2019 10:39 AM    [facility]

## 2019-08-25 NOTE — Patient Instructions (Signed)
Medication Instructions:  Your physician has recommended you make the following change in your medication:   STOP: amlodipine   START: Bystolic 20 mg daily  INCREASE: Lasix to 40 mg twice daily  INCREASE: Potassium to 40 meq daily   *If you need a refill on your cardiac medications before your next appointment, please call your pharmacy*   Lab Work: Your physician recommends that you return for lab work today: bmp, mg   If you have labs (blood work) drawn today and your tests are completely normal, you will receive your results only by: Marland Kitchen MyChart Message (if you have MyChart) OR . A paper copy in the mail If you have any lab test that is abnormal or we need to change your treatment, we will call you to review the results.   Testing/Procedures: None.    Follow-Up: At [facility], you and your health needs are our priority.  As part of our continuing mission to provide you with exceptional heart care, we have created designated Provider Care Teams.  These Care Teams include your primary Cardiologist (physician) and Advanced Practice Providers (APPs -  Physician Assistants and Nurse Practitioners) who all work together to provide you with the care you need, when you need it.  We recommend signing up for the patient portal called "MyChart".  Sign up information is provided on this After Visit Summary.  MyChart is used to connect with patients for Virtual Visits (Telemedicine).  Patients are able to view lab/test results, encounter notes, upcoming appointments, etc.  Non-urgent messages can be sent to your provider as well.   To learn more about what you can do with MyChart, go to [email or web address].    Your next appointment:   1 month(s)  The format for your next appointment:   In Person  Provider:   Thomasene Ripple, DO   Other Instructions

## 2019-08-26 ENCOUNTER — Telehealth: Payer: Self-pay | Admitting: *Deleted

## 2019-08-26 MED ORDER — CARVEDILOL 6.25 MG PO TABS: 6.2500 mg | ORAL_TABLET | Freq: Two times a day (BID) | ORAL | 3 refills | Status: DC

## 2019-08-26 NOTE — Progress Notes (Signed)
Yes, please let him know that I will send coreg 6.25 mg twice daily - I check and it is on the [facility] $4 list and 90 day supply should be $9. Thanks for letting me know.

## 2019-08-26 NOTE — Telephone Encounter (Signed)
-----   Message from Thomasene Ripple, DO sent at 08/26/2019 11:32 AM EDT ----- Yes, please let him know that I will send coreg 6.25 mg twice daily - I check and it is on the [facility] $4 list and 90 day supply should be $9. Thanks for letting me know.

## 2019-09-26 ENCOUNTER — Encounter: Payer: Self-pay | Admitting: Cardiology

## 2019-09-26 ENCOUNTER — Other Ambulatory Visit: Payer: Self-pay

## 2019-09-26 ENCOUNTER — Ambulatory Visit (INDEPENDENT_AMBULATORY_CARE_PROVIDER_SITE_OTHER): Payer: BC Managed Care – PPO | Admitting: Cardiology

## 2019-09-26 VITALS — BP 150/100 | HR 79 | Ht 68.5 in | Wt 246.0 lb

## 2019-09-26 DIAGNOSIS — G4733 Obstructive sleep apnea (adult) (pediatric): Secondary | ICD-10-CM

## 2019-09-26 DIAGNOSIS — E782 Mixed hyperlipidemia: Secondary | ICD-10-CM

## 2019-09-26 DIAGNOSIS — Z9989 Dependence on other enabling machines and devices: Secondary | ICD-10-CM

## 2019-09-26 DIAGNOSIS — R0602 Shortness of breath: Secondary | ICD-10-CM

## 2019-09-26 DIAGNOSIS — I10 Essential (primary) hypertension: Secondary | ICD-10-CM | POA: Diagnosis not present

## 2019-09-26 DIAGNOSIS — I350 Nonrheumatic aortic (valve) stenosis: Secondary | ICD-10-CM

## 2019-09-26 DIAGNOSIS — Z79899 Other long term (current) drug therapy: Secondary | ICD-10-CM | POA: Diagnosis not present

## 2019-09-26 DIAGNOSIS — G459 Transient cerebral ischemic attack, unspecified: Secondary | ICD-10-CM

## 2019-09-26 MED ORDER — CARVEDILOL 12.5 MG PO TABS: 12.5000 mg | ORAL_TABLET | Freq: Two times a day (BID) | ORAL | 3 refills | Status: DC

## 2019-09-26 MED ORDER — FUROSEMIDE 40 MG PO TABS: 100.0000 mg | ORAL_TABLET | Freq: Every day | ORAL | 3 refills | Status: DC

## 2019-09-26 NOTE — Patient Instructions (Signed)
Medication Instructions:  1) Increase Carvedilol (Coreg) to 12.5 mg twice a day   2) Increase Furosemide (Lasix) to 60 mg in the morning and 40 mg in the evening   *If you need a refill on your cardiac medications before your next appointment, please call your pharmacy*   Lab Work: Bmp, mag, Bnp- today   If you have labs (blood work) drawn today and your tests are completely normal, you will receive your results only by: Marland Kitchen MyChart Message (if you have MyChart) OR . A paper copy in the mail If you have any lab test that is abnormal or we need to change your treatment, we will call you to review the results.   Testing/Procedures: Your physician has requested that you have an echocardiogram. Echocardiography is a painless test that uses sound waves to create images of your heart. It provides your doctor with information about the size and shape of your heart and how well your heart's chambers and valves are working. This procedure takes approximately one hour. There are no restrictions for this procedure.     Follow-Up: At [facility], you and your health needs are our priority.  As part of our continuing mission to provide you with exceptional heart care, we have created designated Provider Care Teams.  These Care Teams include your primary Cardiologist (physician) and Advanced Practice Providers (APPs -  Physician Assistants and Nurse Practitioners) who all work together to provide you with the care you need, when you need it.  We recommend signing up for the patient portal called "MyChart".  Sign up information is provided on this After Visit Summary.  MyChart is used to connect with patients for Virtual Visits (Telemedicine).  Patients are able to view lab/test results, encounter notes, upcoming appointments, etc.  Non-urgent messages can be sent to your provider as well.   To learn more about what you can do with MyChart, go to [email or web address].    Your next appointment:   2  week(s)  The format for your next appointment:   In Person  Provider:   Thomasene Ripple, DO   Other Instructions None

## 2019-09-26 NOTE — Progress Notes (Addendum)
Cardiology Office Note:    Date:  09/26/2019   ID:  Frederick Gomez, DOB January 22, 1961, MRN [id number]  PCP:  Ninfa Meeker, FNP  Cardiologist:  Thomasene Ripple, DO  Electrophysiologist:  None   Referring MD: Vertell Novak*   Chief Complaint  Patient presents with  . Follow-up    History of Present Illness:    Frederick Gomez is a 59 y.o. male with a hx of hypertension, diabetes, hyperlipidemia, mild aortic stenosis, history of COVID-19 infection, history of TIA, obesity, OSA on CPAP at home presents today for follow-up visit.  The patient tells me that he has been experiencing significant shortness of breath for the last several weeks mostly at work.  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  He denies any chest pain, lightheadedness dizziness.   Past Medical History:  Diagnosis Date  . Ascending aortic aneurysm (HCC) 03/30/2019  . COVID-19 virus infection 07/12/2019  . Diabetes (HCC)   . Essential hypertension 03/30/2019  . Hyperlipidemia   . Hypertension   . Mild aortic regurgitation 03/30/2019  . Mild aortic stenosis 03/30/2019  . Mixed hyperlipidemia 03/30/2019  . Obese   . Obesity (BMI 30-39.9) 03/30/2019  . OSA (obstructive sleep apnea) 04/01/2016  . OSA on CPAP   . Seasonal allergies   . TIA (transient ischemic attack)   . Type 2 diabetes mellitus without complication, without long-term current use of insulin (HCC) 03/30/2019    Past Surgical History:  Procedure Laterality Date  . CARDIAC CATHETERIZATION    . HEMORROIDECTOMY    . NOSE SURGERY      Current Medications: Current Meds  Medication Sig  . albuterol (VENTOLIN HFA) 108 (90 Base) MCG/ACT inhaler Inhale 2 puffs into the lungs every 6 (six) hours as needed for wheezing or shortness of breath.  . clopidogrel (PLAVIX) 75 MG tablet Take 75 mg by mouth daily.   . furosemide (LASIX) 40 MG tablet Take 2.5 tablets (100  mg total) by mouth daily. Take 60 mg in the morning and 40 mg in the evening  . lovastatin (MEVACOR) 20 MG tablet Take 20 mg by mouth at bedtime.   . metFORMIN (GLUCOPHAGE) 850 MG tablet Take 850 mg by mouth 2 (two) times daily.  . polycarbophil (FIBERCON) 625 MG tablet Take 625 mg by mouth daily as needed (constipation.).   Marland Kitchen potassium chloride SA (KLOR-CON) 20 MEQ tablet Take 2 tablets (40 mEq total) by mouth daily.  . primidone (MYSOLINE) 250 MG tablet Take 250 mg by mouth daily as needed (tremor).   . Vitamin D, Ergocalciferol, (DRISDOL) 1.25 MG (50000 UNIT) CAPS capsule Take 50,000 Units by mouth once a week.  . [DISCONTINUED] carvedilol (COREG) 6.25 MG tablet Take 1 tablet (6.25 mg total) by mouth 2 (two) times daily.  . [DISCONTINUED] furosemide (LASIX) 40 MG tablet Take 1 tablet (40 mg total) by mouth 2 (two) times daily.     Allergies:   Hydrochlorothiazide, Niaspan [niacin], Pravastatin, Simvastatin, and Latex   Social History   Socioeconomic History  . Marital status: Single    Spouse name: Not on file  . Number of children: Not on file  . Years of education: Not on file  . Highest education level: Not on file  Occupational History  . Not on file  Tobacco Use  . Smoking status: Former Smoker    Years: 3.00  Types: Cigarettes    Quit date: 06/10/1979    Years since quitting: 40.3  . Smokeless tobacco: Never Used  Substance and Sexual Activity  . Alcohol use: Never  . Drug use: Never  . Sexual activity: Not Currently  Other Topics Concern  . Not on file  Social History Narrative  . Not on file   Social Determinants of Health   Financial Resource Strain:   . Difficulty of Paying Living Expenses:   Food Insecurity:   . Worried About Programme researcher, broadcasting/film/videounning Out of Food in the Last Year:   . Baristaan Out of Food in the Last Year:   Transportation Needs:   . Freight forwarderLack of Transportation (Medical):   Marland Kitchen. Lack of Transportation (Non-Medical):   Physical Activity:   . Days of Exercise per Week:    . Minutes of Exercise per Session:   Stress:   . Feeling of Stress :   Social Connections:   . Frequency of Communication with Friends and Family:   . Frequency of Social Gatherings with Friends and Family:   . Attends Religious Services:   . Active Member of Clubs or Organizations:   . Attends BankerClub or Organization Meetings:   Marland Kitchen. Marital Status:      Family History: The patient's family history includes Diabetes in his mother; Heart attack in his mother; Suicidality in his father.  ROS:   Review of Systems  Constitution: Negative for decreased appetite, fever and weight gain.  HENT: Negative for congestion, ear discharge, hoarse voice and sore throat.   Eyes: Negative for discharge, redness, vision loss in right eye and visual halos.  Cardiovascular: Negative for chest pain, dyspnea on exertion, leg swelling, orthopnea and palpitations.  Respiratory: Reports shortness of breath.  Negative for cough, hemoptysis, and snoring.   Endocrine: Negative for heat intolerance and polyphagia.  Hematologic/Lymphatic: Negative for bleeding problem. Does not bruise/bleed easily.  Skin: Negative for flushing, nail changes, rash and suspicious lesions.  Musculoskeletal: Negative for arthritis, joint pain, muscle cramps, myalgias, neck pain and stiffness.  Gastrointestinal: Negative for abdominal pain, bowel incontinence, diarrhea and excessive appetite.  Genitourinary: Negative for decreased libido, genital sores and incomplete emptying.  Neurological: Negative for brief paralysis, focal weakness, headaches and loss of balance.  Psychiatric/Behavioral: Negative for altered mental status, depression and suicidal ideas.  Allergic/Immunologic: Negative for HIV exposure and persistent infections.    EKGs/Labs/Other Studies Reviewed:    The following studies were reviewed today:   EKG: None today.  Thoracic echocardiogram IMPRESSIONS 03/2019 1. Left ventricular ejection fraction, by visual  estimation, is 60 to  65%. The left ventricle has normal function. Normal left ventricular size.  There is mildly increased left ventricular hypertrophy.  2. Left ventricular diastolic Doppler parameters are consistent with  impaired relaxation pattern of LV diastolic filling.  3. Global right ventricle has normal systolic function.The right  ventricular size is normal. No increase in right ventricular wall thickness.  4. Left atrial size was normal.  5. Right atrial size was normal.  6. The mitral valve is normal in structure. No evidence of mitral valve regurgitation. No evidence of mitral stenosis.  7. The tricuspid valve is normal in structure. Tricuspid valve regurgitation was not visualized by color flow Doppler.  8. The aortic valve was not well visualized Aortic valve regurgitation is mild by color flow Doppler. Mild aortic valve stenosis.  9. Can not exclude bicuspid aortic valve.  10. The pulmonic valve was normal in structure. Pulmonic valve  regurgitation is not  visualized by color flow Doppler.  11. There is moderate dilatation of the ascending aorta measuring 41 mm.  12. Normal pulmonary artery systolic pressure.  13. The inferior vena cava is normal in size with greater than 50%  respiratory variability, suggesting right atrial pressure of 3 mmHg.   Recent Labs: 07/16/2019: ALT 34; B Natriuretic Peptide 129.3 07/17/2019: Hemoglobin 15.0; Platelets 305 08/25/2019: BUN 12; Creatinine, Ser 0.87; Magnesium 2.0; Potassium 4.3; Sodium 140  Recent Lipid Panel    Component Value Date/Time   CHOL 175 03/29/2019 0858   TRIG 108 03/29/2019 0858   HDL 37 (L) 03/29/2019 0858   CHOLHDL 4.7 03/29/2019 0858   LDLCALC 118 (H) 03/29/2019 0858    Physical Exam:    VS:  BP (!) 150/100 (BP Location: Right Arm, Patient Position: Sitting, Cuff Size: Normal)   Pulse 79   Ht 5' 8.5" (1.74 m)   Wt 246 lb (111.6 kg)   SpO2 95%   BMI 36.86 kg/m     Wt Readings from Last 3  Encounters:  09/26/19 246 lb (111.6 kg)  08/25/19 241 lb (109.3 kg)  07/12/19 239 lb 3.2 oz (108.5 kg)     GEN: Well nourished, well developed in no acute distress HEENT: Normal NECK: No JVD; No carotid bruits LYMPHATICS: No lymphadenopathy CARDIAC: S1S2 noted,RRR, 2/6 mid ejection systolic murmurs, rubs, gallops RESPIRATORY:  Clear to auscultation without rales, wheezing or rhonchi  ABDOMEN: Soft, non-tender, non-distended, +bowel sounds, no guarding. EXTREMITIES: No edema, No cyanosis, no clubbing MUSCULOSKELETAL:  No deformity  SKIN: Warm and dry NEUROLOGIC:  Alert and oriented x 3, non-focal PSYCHIATRIC:  Normal affect, good insight  ASSESSMENT:    1. SOB (shortness of breath)   2. Essential hypertension   3. Mild aortic stenosis   4. Medication management   5. TIA (transient ischemic attack)   6. Mixed hyperlipidemia   7. OSA on CPAP    PLAN:     His shortness of breath could be multifactorial: I have asked him to start taking his albuterol as needed to see if this is going to help.  In the meantime I have also asked him to increase his Lasix to 60 mg in a.m. and 40 mg at night.  He will continue potassium supplement 40 mg equivalents daily.  I also will get blood work today which includes BMP, mag and BNP to assess given his bilateral +1 edema.  He denies orthopnea PND at this time.  In addition his echo in October 2020 showed mild aortic stenosis I am going to get a repeat echocardiogram to assess for any progression of his valvular disease.  Hypertension-his blood pressure still not yet at goal we started him on Coreg 6.25 mg twice a day he tells me he has been taking his medication so I am going to increase this to 12 mg twice a day.  Hyperlipidemia-continue patient on his lovastatin.  OSA-the patient is on CPAP.  I spoke with him about seeing his PCP for hopefully repeat sleep study as he tells me that his CPAP has been falling off lately.  Obesity-the patient  understands the need to lose weight with diet and exercise. We have discussed specific strategies for this.  The patient is in agreement with the above plan. The patient left the office in stable condition.  The patient will follow up in 2 weeks for medication management.   Medication Adjustments/Labs and Tests Ordered: Current medicines are reviewed at length with the patient today.  Concerns regarding medicines are outlined above.  Orders Placed This Encounter  Procedures  . Pro b natriuretic peptide (BNP)  . Basic metabolic panel  . Magnesium  . ECHOCARDIOGRAM COMPLETE   Meds ordered this encounter  Medications  . carvedilol (COREG) 12.5 MG tablet    Sig: Take 1 tablet (12.5 mg total) by mouth 2 (two) times daily.    Dispense:  180 tablet    Refill:  3  . furosemide (LASIX) 40 MG tablet    Sig: Take 2.5 tablets (100 mg total) by mouth daily. Take 60 mg in the morning and 40 mg in the evening    Dispense:  180 tablet    Refill:  3    Patient Instructions  Medication Instructions:  1) Increase Carvedilol (Coreg) to 12.5 mg twice a day   2) Increase Furosemide (Lasix) to 60 mg in the morning and 40 mg in the evening   *If you need a refill on your cardiac medications before your next appointment, please call your pharmacy*   Lab Work: Bmp, mag, Bnp- today   If you have labs (blood work) drawn today and your tests are completely normal, you will receive your results only by: Marland Kitchen MyChart Message (if you have MyChart) OR . A paper copy in the mail If you have any lab test that is abnormal or we need to change your treatment, we will call you to review the results.   Testing/Procedures: Your physician has requested that you have an echocardiogram. Echocardiography is a painless test that uses sound waves to create images of your heart. It provides your doctor with information about the size and shape of your heart and how well your heart's chambers and valves are working. This  procedure takes approximately one hour. There are no restrictions for this procedure.     Follow-Up: At [facility], you and your health needs are our priority.  As part of our continuing mission to provide you with exceptional heart care, we have created designated Provider Care Teams.  These Care Teams include your primary Cardiologist (physician) and Advanced Practice Providers (APPs -  Physician Assistants and Nurse Practitioners) who all work together to provide you with the care you need, when you need it.  We recommend signing up for the patient portal called "MyChart".  Sign up information is provided on this After Visit Summary.  MyChart is used to connect with patients for Virtual Visits (Telemedicine).  Patients are able to view lab/test results, encounter notes, upcoming appointments, etc.  Non-urgent messages can be sent to your provider as well.   To learn more about what you can do with MyChart, go to [email or web address].    Your next appointment:   2 week(s)  The format for your next appointment:   In Person  Provider:   Thomasene Ripple, DO   Other Instructions None      Adopting a Healthy Lifestyle.  Know what a healthy weight is for you (roughly BMI <25) and aim to maintain this   Aim for 7+ servings of fruits and vegetables daily   65-80+ fluid ounces of water or unsweet tea for healthy kidneys   Limit to max 1 drink of alcohol per day; avoid smoking/tobacco   Limit animal fats in diet for cholesterol and heart health - choose grass fed whenever available   Avoid highly processed foods, and foods high in saturated/trans fats   Aim for low stress - take time to unwind and care for your  mental health   Aim for 150 min of moderate intensity exercise weekly for heart health, and weights twice weekly for bone health   Aim for 7-9 hours of sleep daily   When it comes to diets, agreement about the perfect plan isnt easy to find, even among the  experts. Experts at the [employer] developed an idea known as the Healthy Eating Plate. Just imagine a plate divided into logical, healthy portions.   The emphasis is on diet quality:   Load up on vegetables and fruits - one-half of your plate: Aim for color and variety, and remember that potatoes dont count.   Go for whole grains - one-quarter of your plate: Whole wheat, barley, wheat berries, quinoa, oats, brown rice, and foods made with them. If you want pasta, go with whole wheat pasta.   Protein power - one-quarter of your plate: Fish, chicken, beans, and nuts are all healthy, versatile protein sources. Limit red meat.   The diet, however, does go beyond the plate, offering a few other suggestions.   Use healthy plant oils, such as olive, canola, soy, corn, sunflower and peanut. Check the labels, and avoid partially hydrogenated oil, which have unhealthy trans fats.   If youre thirsty, drink water. Coffee and tea are good in moderation, but skip sugary drinks and limit milk and dairy products to one or two daily servings.   The type of carbohydrate in the diet is more important than the amount. Some sources of carbohydrates, such as vegetables, fruits, whole grains, and beans-are healthier than others.   Finally, stay active  Signed, Berniece Salines, DO  09/26/2019 11:54 AM    [place]

## 2019-09-27 ENCOUNTER — Telehealth: Payer: Self-pay

## 2019-09-27 LAB — BASIC METABOLIC PANEL
BUN/Creatinine Ratio: 10 (ref 9–20)
BUN: 10 mg/dL (ref 6–24)
CO2: 23 mmol/L (ref 20–29)
Calcium: 9.1 mg/dL (ref 8.7–10.2)
Chloride: 101 mmol/L (ref 96–106)
Creatinine, Ser: 0.99 mg/dL (ref 0.76–1.27)
GFR calc Af Amer: 98 mL/min/{1.73_m2} (ref >59)
GFR calc non Af Amer: 85 mL/min/{1.73_m2} (ref >59)
Glucose: 239 mg/dL — ABNORMAL HIGH (ref 65–99)
Potassium: 4.5 mmol/L (ref 3.5–5.2)
Sodium: 140 mmol/L (ref 134–144)

## 2019-09-27 LAB — MAGNESIUM: Magnesium: 2.1 mg/dL (ref 1.6–2.3)

## 2019-09-27 LAB — PRO B NATRIURETIC PEPTIDE: NT-Pro BNP: 135 pg/mL (ref 0–210)

## 2019-09-27 NOTE — Telephone Encounter (Signed)
Patient returning Morgan's call.  

## 2019-09-27 NOTE — Telephone Encounter (Signed)
Spoke with patient regarding results and recommendation.  Patient verbalizes understanding and is agreeable to plan of care. Advised patient to call back with any issues or concerns.  

## 2019-09-27 NOTE — Telephone Encounter (Signed)
-----   Message from Thomasene Ripple, DO sent at 09/27/2019  8:13 AM EDT ----- Blood glucose is slightly elevated, have asked the patient he needs to continue take his Metformin his blood sugars 239 kidney function electrolytes are normal as well as his BMP.  Please remind him that he should only take the 60 mg Lasix in the morning and 40 mg Lasix in the evening for 1 week, starting Sunday he should go back on 40 mg twice a day.  I will see him on May 11

## 2019-09-27 NOTE — Telephone Encounter (Signed)
Left message on patients voicemail to please return our call.   

## 2019-10-18 ENCOUNTER — Other Ambulatory Visit: Payer: BC Managed Care – PPO

## 2020-05-14 ENCOUNTER — Telehealth: Payer: Self-pay | Admitting: Cardiology

## 2020-05-14 NOTE — Telephone Encounter (Signed)
Appointment made for 05/21/20. Offered 05/14/20 at 2:00 but pt declined.

## 2020-05-14 NOTE — Telephone Encounter (Signed)
Pt c/o Shortness Of Breath: STAT if SOB developed within the last 24 hours or pt is noticeably SOB on the phone  1. Are you currently SOB (can you hear that pt is SOB on the phone)? No   2. How long have you been experiencing SOB? Started last month   3. Are you SOB when sitting or when up moving around? Both    4. Are you currently experiencing any other symptoms? No   Edd is calling requesting an appointment in regards to this. Please advise.

## 2020-05-16 ENCOUNTER — Other Ambulatory Visit: Payer: Self-pay

## 2020-05-16 DIAGNOSIS — E119 Type 2 diabetes mellitus without complications: Secondary | ICD-10-CM | POA: Insufficient documentation

## 2020-05-16 DIAGNOSIS — J302 Other seasonal allergic rhinitis: Secondary | ICD-10-CM | POA: Insufficient documentation

## 2020-05-16 DIAGNOSIS — E785 Hyperlipidemia, unspecified: Secondary | ICD-10-CM | POA: Insufficient documentation

## 2020-05-16 DIAGNOSIS — I10 Essential (primary) hypertension: Secondary | ICD-10-CM | POA: Insufficient documentation

## 2020-05-16 DIAGNOSIS — G4733 Obstructive sleep apnea (adult) (pediatric): Secondary | ICD-10-CM | POA: Insufficient documentation

## 2020-05-16 DIAGNOSIS — Z9989 Dependence on other enabling machines and devices: Secondary | ICD-10-CM | POA: Insufficient documentation

## 2020-05-16 DIAGNOSIS — E669 Obesity, unspecified: Secondary | ICD-10-CM | POA: Insufficient documentation

## 2020-05-21 ENCOUNTER — Encounter: Payer: Self-pay | Admitting: Cardiology

## 2020-05-21 ENCOUNTER — Ambulatory Visit: Payer: BC Managed Care – PPO | Admitting: Cardiology

## 2020-05-21 ENCOUNTER — Other Ambulatory Visit: Payer: Self-pay

## 2020-05-21 VITALS — BP 140/80 | HR 79 | Ht 68.0 in | Wt 247.2 lb

## 2020-05-21 DIAGNOSIS — I350 Nonrheumatic aortic (valve) stenosis: Secondary | ICD-10-CM

## 2020-05-21 DIAGNOSIS — R0600 Dyspnea, unspecified: Secondary | ICD-10-CM

## 2020-05-21 DIAGNOSIS — I10 Essential (primary) hypertension: Secondary | ICD-10-CM | POA: Diagnosis not present

## 2020-05-21 DIAGNOSIS — E119 Type 2 diabetes mellitus without complications: Secondary | ICD-10-CM

## 2020-05-21 DIAGNOSIS — I7121 Aneurysm of the ascending aorta, without rupture: Secondary | ICD-10-CM

## 2020-05-21 DIAGNOSIS — I351 Nonrheumatic aortic (valve) insufficiency: Secondary | ICD-10-CM

## 2020-05-21 DIAGNOSIS — E669 Obesity, unspecified: Secondary | ICD-10-CM

## 2020-05-21 DIAGNOSIS — E782 Mixed hyperlipidemia: Secondary | ICD-10-CM

## 2020-05-21 DIAGNOSIS — Z9989 Dependence on other enabling machines and devices: Secondary | ICD-10-CM

## 2020-05-21 DIAGNOSIS — E559 Vitamin D deficiency, unspecified: Secondary | ICD-10-CM

## 2020-05-21 DIAGNOSIS — G4733 Obstructive sleep apnea (adult) (pediatric): Secondary | ICD-10-CM

## 2020-05-21 DIAGNOSIS — R9431 Abnormal electrocardiogram [ECG] [EKG]: Secondary | ICD-10-CM

## 2020-05-21 DIAGNOSIS — I712 Thoracic aortic aneurysm, without rupture: Secondary | ICD-10-CM

## 2020-05-21 DIAGNOSIS — R0609 Other forms of dyspnea: Secondary | ICD-10-CM

## 2020-05-21 NOTE — Progress Notes (Addendum)
Cardiology Office Note:    Date:  05/21/2020   ID:  Frederick Gomez, DOB [birth year], MRN [id number]  PCP:  Ninfa Meeker, FNP  Cardiologist:  Thomasene Ripple, DO  Electrophysiologist:  None   Referring MD: Vertell Novak*   " I am doing well"   History of Present Illness:    Frederick Gomez is a 59 y.o. male with a hx of hypertension, diabetes mellitus, hyperlipidemia, mild aortic stenosis, history of COVID-19 infection, history of TIA, obesity, OSA on CPAP at home presents today for follow-up visit.  I last the patient on September 26, 2019 at that time he reported that he had some shortness of breath.  At the conclusion of visit I did talk with the patient about taking his albuterol as prescribed and also increase his Lasix to 60 mg in the morning 40 in the afternoon.  I recommended the patient get an echocardiogram at that time to reassess his valvular abnormalities. Unfortunately he did not get his testing done.   He tells me over the last several months his shortness of breath has worsened on exertion.  And he really comes on with minimal exertion.  He is concerned as this is making him a lot measurable. He denies any chest pain.  Past Medical History:  Diagnosis Date  . Ascending aortic aneurysm (HCC) 03/30/2019  . COVID-19 virus infection 07/12/2019  . Diabetes (HCC)   . Essential hypertension 03/30/2019  . Hyperlipidemia   . Hypertension   . Mild aortic regurgitation 03/30/2019  . Mild aortic stenosis 03/30/2019  . Mixed hyperlipidemia 03/30/2019  . Obese   . Obesity (BMI 30-39.9) 03/30/2019  . OSA (obstructive sleep apnea) 04/01/2016  . OSA on CPAP   . Seasonal allergies   . TIA (transient ischemic attack)   . Type 2 diabetes mellitus without complication, without long-term current use of insulin (HCC) 03/30/2019    Past Surgical History:  Procedure Laterality Date  . CARDIAC CATHETERIZATION    . HEMORROIDECTOMY    . NOSE SURGERY      Current  Medications: Current Meds  Medication Sig  . albuterol (VENTOLIN HFA) 108 (90 Base) MCG/ACT inhaler Inhale 2 puffs into the lungs every 6 (six) hours as needed for wheezing or shortness of breath.  . carvedilol (COREG) 12.5 MG tablet Take 1 tablet (12.5 mg total) by mouth 2 (two) times daily.  . clopidogrel (PLAVIX) 75 MG tablet Take 75 mg by mouth daily.   Marland Kitchen doxycycline (VIBRA-TABS) 100 MG tablet Take 100 mg by mouth 2 (two) times daily.  . furosemide (LASIX) 40 MG tablet Take 2.5 tablets (100 mg total) by mouth daily. Take 60 mg in the morning and 40 mg in the evening  . guaiFENesin-codeine 100-10 MG/5ML syrup Take by mouth.  . lovastatin (MEVACOR) 20 MG tablet Take 20 mg by mouth at bedtime.   . metFORMIN (GLUCOPHAGE) 850 MG tablet Take 850 mg by mouth 2 (two) times daily.  . polycarbophil (FIBERCON) 625 MG tablet Take 625 mg by mouth daily as needed (constipation.).   Marland Kitchen Potassium Chloride ER 20 MEQ TBCR Take 1 tablet by mouth 2 (two) times daily.  . potassium chloride SA (KLOR-CON) 20 MEQ tablet Take 2 tablets (40 mEq total) by mouth daily.  . primidone (MYSOLINE) 250 MG tablet Take 250 mg by mouth daily as needed (tremor).   . Vitamin D, Ergocalciferol, (DRISDOL) 1.25 MG (50000 UNIT) CAPS capsule Take 50,000 Units by mouth once a week.  Allergies:   Hydrochlorothiazide, Niaspan [niacin], Pravastatin, Simvastatin, and Latex   Social History   Socioeconomic History  . Marital status: Single    Spouse name: Not on file  . Number of children: Not on file  . Years of education: Not on file  . Highest education level: Not on file  Occupational History  . Not on file  Tobacco Use  . Smoking status: Former Smoker    Years: 3.00    Types: Cigarettes    Quit date: 06/10/1979    Years since quitting: 40.9  . Smokeless tobacco: Never Used  Vaping Use  . Vaping Use: Never used  Substance and Sexual Activity  . Alcohol use: Never  . Drug use: Never  . Sexual activity: Not Currently   Other Topics Concern  . Not on file  Social History Narrative  . Not on file   Social Determinants of Health   Financial Resource Strain: Not on file  Food Insecurity: Not on file  Transportation Needs: Not on file  Physical Activity: Not on file  Stress: Not on file  Social Connections: Not on file     Family History: The patient's family history includes Diabetes in his mother; Heart attack in his mother; Suicidality in his father.  ROS:   Review of Systems  Constitution: Negative for decreased appetite, fever and weight gain.  HENT: Negative for congestion, ear discharge, hoarse voice and sore throat.   Eyes: Negative for discharge, redness, vision loss in right eye and visual halos.  Cardiovascular: Negative for chest pain, dyspnea on exertion, leg swelling, orthopnea and palpitations.  Respiratory: Negative for cough, hemoptysis, shortness of breath and snoring.   Endocrine: Negative for heat intolerance and polyphagia.  Hematologic/Lymphatic: Negative for bleeding problem. Does not bruise/bleed easily.  Skin: Negative for flushing, nail changes, rash and suspicious lesions.  Musculoskeletal: Negative for arthritis, joint pain, muscle cramps, myalgias, neck pain and stiffness.  Gastrointestinal: Negative for abdominal pain, bowel incontinence, diarrhea and excessive appetite.  Genitourinary: Negative for decreased libido, genital sores and incomplete emptying.  Neurological: Negative for brief paralysis, focal weakness, headaches and loss of balance.  Psychiatric/Behavioral: Negative for altered mental status, depression and suicidal ideas.  Allergic/Immunologic: Negative for HIV exposure and persistent infections.    EKGs/Labs/Other Studies Reviewed:    The following studies were reviewed today:   EKG:  The ekg ordered today demonstrates sinus rhythm, heart rate 70 bpm with left atrial enlargement and poor R wave progression in the precordial leads cannot rule out  anterior infarction of age indeterminate, compared to prior EKG precordial leads changes are new.  Recent Labs: 07/16/2019: ALT 34; B Natriuretic Peptide 129.3 07/17/2019: Hemoglobin 15.0; Platelets 305 09/26/2019: BUN 10; Creatinine, Ser 0.99; Magnesium 2.1; NT-Pro BNP 135; Potassium 4.5; Sodium 140  Recent Lipid Panel    Component Value Date/Time   CHOL 175 03/29/2019 0858   TRIG 108 03/29/2019 0858   HDL 37 (L) 03/29/2019 0858   CHOLHDL 4.7 03/29/2019 0858   LDLCALC 118 (H) 03/29/2019 0858    Physical Exam:    VS:  BP 140/80   Pulse 79   Ht 5\' 8"  (1.727 m)   Wt 247 lb 3.2 oz (112.1 kg)   SpO2 98%   BMI 37.59 kg/m     Wt Readings from Last 3 Encounters:  05/21/20 247 lb 3.2 oz (112.1 kg)  09/26/19 246 lb (111.6 kg)  08/25/19 241 lb (109.3 kg)     GEN: Well nourished, well developed in  no acute distress HEENT: Normal NECK: No JVD; No carotid bruits LYMPHATICS: No lymphadenopathy CARDIAC: S1S2 noted,RRR, 2/6 systolic murmurs, rubs, gallops RESPIRATORY:  Clear to auscultation without rales, wheezing or rhonchi  ABDOMEN: Soft, non-tender, non-distended, +bowel sounds, no guarding. EXTREMITIES:bilateral ankle edema, No cyanosis, no clubbing MUSCULOSKELETAL:  No deformity  SKIN: Warm and dry NEUROLOGIC:  Alert and oriented x 3, non-focal PSYCHIATRIC:  Normal affect, good insight  ASSESSMENT:    1. Dyspnea on exertion   2. Abnormal EKG   3. Ascending aortic aneurysm (HCC)   4. Essential hypertension   5. Mild aortic stenosis   6. Mild aortic regurgitation   7. OSA on CPAP   8. Type 2 diabetes mellitus without complication, without long-term current use of insulin (HCC)   9. Mixed hyperlipidemia   10. Obesity (BMI 30-39.9)   11. Vitamin D deficiency    PLAN:    His worsening symptoms is concerning I do suspect that his shortness of breath is his anginal equivalent.  And he has had now a slight change on his EKG which I like to proceed with an ischemic evaluation.  He  is a high risk patient with symptoms and I like to proceed with a right and left heart catheterization in this gentleman.  We discussed the heart catheterization and explained the testing to him, the patient understands that risks include but are not limited to stroke (1 in 1000), death (1 in 1000), kidney failure [usually temporary] (1 in 500), bleeding (1 in 200), allergic reaction [possibly serious] (1 in 200), and agrees to proceed.  Hypertension-he said his blood pressure is otherwise good control he has not taken his medication this morning his blood pressure is acceptable.  We will continue his current medication regimen.  Hyperlipidemia continue his lovastatin.  Obstructive sleep apnea-he has a CPAP which back in April he told me he had been following up at that time I discussed with the patient that he needed to talk to his PCP for having a repeat study or reordering a new mask which this had not been done.  Obesity-the patient understands the need to lose weight with diet and exercise. We have discussed specific strategies for this.  I also do think that his shortness of breath may be a factor of pulmonary component and I have referred the patient to see pulmonary as well.  He has questions about writing a note for his work and we were able to do that today.  He plans to apply for FMLA as well.  Blood work will be done today which will include CBC, BMP, vitamin D  The patient is in agreement with the above plan. The patient left the office in stable condition.  The patient will follow up in 2 weeks post heart catheterization.   Medication Adjustments/Labs and Tests Ordered: Current medicines are reviewed at length with the patient today.  Concerns regarding medicines are outlined above.  Orders Placed This Encounter  Procedures  . Basic metabolic panel  . CBC with Differential/Platelet  . VITAMIN D 25 Hydroxy (Vit-D Deficiency, Fractures)  . Ambulatory referral to Pulmonology  .  EKG 12-Lead   No orders of the defined types were placed in this encounter.   Patient Instructions  Medication Instructions:  No medication changes. *If you need a refill on your cardiac medications before your next appointment, please call your pharmacy*   Lab Work: Your physician recommends that you have labs done in the office today. Your test included  basic metabolic panel, complete blood count, and vitamin D level.  If you have labs (blood work) drawn today and your tests are completely normal, you will receive your results only by: Marland Kitchen MyChart Message (if you have MyChart) OR . A paper copy in the mail If you have any lab test that is abnormal or we need to change your treatment, we will call you to review the results.   Testing/Procedures: Your physician has requested that you have an echocardiogram. Echocardiography is a painless test that uses sound waves to create images of your heart. It provides your doctor with information about the size and shape of your heart and how well your heart's chambers and valves are working. This procedure takes approximately one hour. There are no restrictions for this procedure.     [facility] CARDIOVASCULAR DIVISION [facility] AT [employer] [address] [place] [address] Dept: [phone] Loc: [phone]  Frederick Gomez  05/21/2020  You are scheduled for a Cardiac Catheterization on Tuesday, December 21 with Dr. Lance Muss.  1. Please arrive at the [facility] (Main Entrance A) at [facility]: [address] [place], [place] [address] at 7:00 AM (This time is two hours before your procedure to ensure your preparation). Free valet parking service is available.   Special note: Every effort is made to have your procedure done on time. Please understand that emergencies sometimes delay scheduled procedures.  2. Diet: Do not eat solid foods after midnight.  The patient may have clear  liquids until 5am upon the day of the procedure.  3. Labs: You had your labs done today in the office.  4. Medication instructions in preparation for your procedure:   Contrast Allergy: No  Stop taking, Lasix (Furosemide)  Tuesday, December 21,  Do not take Diabetes Med Glucophage (Metformin) on the day of the procedure and HOLD 48 HOURS AFTER THE PROCEDURE.  On the morning of your procedure, take your Plavix/Clopidogrel and any morning medicines NOT listed above.  You may use sips of water.  5. Plan for one night stay--bring personal belongings. 6. Bring a current list of your medications and current insurance cards. 7. You MUST have a responsible person to drive you home. 8. Someone MUST be with you the first 24 hours after you arrive home or your discharge will be delayed. 9. Please wear clothes that are easy to get on and off and wear slip-on shoes.  Thank you for allowing [place] to care for you!   -- [facility]    Follow-Up: At [facility], you and your health needs are our priority.  As part of our continuing mission to provide you with exceptional heart care, we have created designated Provider Care Teams.  These Care Teams include your primary Cardiologist (physician) and Advanced Practice Providers (APPs -  Physician Assistants and Nurse Practitioners) who all work together to provide you with the care you need, when you need it.  We recommend signing up for the patient portal called "MyChart".  Sign up information is provided on this After Visit Summary.  MyChart is used to connect with patients for Virtual Visits (Telemedicine).  Patients are able to view lab/test results, encounter notes, upcoming appointments, etc.  Non-urgent messages can be sent to your provider as well.   To learn more about what you can do with MyChart, go to [email or web address].    Your next appointment:   2 week(s)  The format for  your next appointment:   In  Person  Provider:   Thomasene Ripple, DO   Other Instructions  Coronary Angioplasty  Coronary angioplasty is a procedure to widen a narrowed or blocked blood vessel of the heart (coronary artery). The artery is usually blocked by cholesterol buildup (plaques) in the lining of the artery walls. When a vessel in the heart becomes partially blocked, there is decreased blood flow to that area. This may lead to chest pain or a heart attack (myocardial infarction). Tell a health care provider about:  Any allergies you have, including allergies to shellfish or contrast dye.  All medicines you are taking, including vitamins, herbs, eye drops, creams, and over-the-counter medicines.  Any problems you or family members have had with anesthetic medicines.  Any blood disorders you have.  Any surgeries you have had.  Any medical conditions you have.  Whether you are pregnant or may be pregnant. What are the risks? Generally, this is a safe procedure. However, problems may occur, including:  Damage to other structures or organs. This may include damage to blood vessels, leading to rupture or bleeding.  Infection, bleeding, or bruising at the site where a small, thin tube (catheter) will be inserted.  Allergic reaction to the dye or contrast that is used.  Kidney damage from the dye or contrast that is used.  Blood clots that can lead to a stroke or heart attack.  Bleeding into the abdomen (retroperitoneal bleeding). What happens before the procedure? Staying hydrated Follow instructions from your health care provider about hydration, which may include:  Up to 2 hours before the procedure - you may continue to drink clear liquids, such as water, clear fruit juice, black coffee, and plain tea. Eating and drinking restrictions Follow instructions from your health care provider about eating and drinking, which may include:  8 hours before the procedure - stop eating heavy meals or foods such  as meat, fried foods, or fatty foods.  6 hours before the procedure - stop eating light meals or foods, such as toast or cereal.  2 hours before the procedure - stop drinking clear liquids. Medicines  Ask your health care provider about: ? Changing or stopping your regular medicines. This is especially important if you are taking diabetes medicines or blood thinners. ? Whether aspirin is recommended before this procedure.  Ask your health care provider if you can take a sip of water with any approved medicines the morning of the procedure. General instructions  Plan to have someone take you home from the hospital or clinic.  If you will be going home right after the procedure, plan to have someone with you for 24 hours. What happens during the procedure?  To reduce your risk of infection: ? Your health care team will wash or sanitize their hands. ? A germ-killing solution (antiseptic) will be used to wash the area where the catheter will be inserted. Hair may be removed from this area. The catheter may be inserted in:  Your groin area. This is the most common area.  The fold of your arm, near your elbow.  Your wrist.  An IV tube will be inserted into one of your veins.  You will be given a medicine to help you relax (sedative).  You will be given a medicine to numb the area where the catheter will be inserted (local anesthetic).  The catheter will be inserted into an artery.  The catheter will be guided to the narrowed or blocked artery using  a type of X-ray (fluoroscopy).  When the catheter is near the heart, dye will be injected that makes the narrowing or blockage visible on the X-ray.  Once the catheter is positioned at the narrowed or blocked portion of the blood vessel, a balloon will be inflated to make the artery wider. Expanding the balloon will crush the plaques into the wall of the vessel and improve the blood flow.  The artery may be made wider by removing  plaques using a drill, laser, or other tools.  When the blood flow is better, the balloon will be deflated and the catheter will be removed.  A stent may be placed. This is common in this procedure.  After the catheter is removed, a special dressing will be placed over the insertion site. What happens after the procedure?  You will need to keep the area still for a few hours, or as long as directed by your health care provider. If the procedure was done in the groin, you will be instructed not to bend or cross your legs.  The insertion site will be checked often.  The pulse in your feet or wrist will be checked often.  Additional blood tests, X-rays, and an electrocardiogram (ECG) may be done.  Do not drive for 24 hours if you were given a sedative. This information is not intended to replace advice given to you by your health care provider. Make sure you discuss any questions you have with your health care provider. Document Revised: 05/08/2017 Document Reviewed: 02/22/2016 Elsevier Patient Education  2020 [employer].  Echocardiogram An echocardiogram is a procedure that uses painless sound waves (ultrasound) to produce an image of the heart. Images from an echocardiogram can provide important information about:  Signs of coronary artery disease (CAD).  Aneurysm detection. An aneurysm is a weak or damaged part of an artery wall that bulges out from the normal force of blood pumping through the body.  Heart size and shape. Changes in the size or shape of the heart can be associated with certain conditions, including heart failure, aneurysm, and CAD.  Heart muscle function.  Heart valve function.  Signs of a past heart attack.  Fluid buildup around the heart.  Thickening of the heart muscle.  A tumor or infectious growth around the heart valves. Tell a health care provider about:  Any allergies you have.  All medicines you are taking, including vitamins, herbs, eye  drops, creams, and over-the-counter medicines.  Any blood disorders you have.  Any surgeries you have had.  Any medical conditions you have.  Whether you are pregnant or may be pregnant. What are the risks? Generally, this is a safe procedure. However, problems may occur, including:  Allergic reaction to dye (contrast) that may be used during the procedure. What happens before the procedure? No specific preparation is needed. You may eat and drink normally. What happens during the procedure?   An IV tube may be inserted into one of your veins.  You may receive contrast through this tube. A contrast is an injection that improves the quality of the pictures from your heart.  A gel will be applied to your chest.  A wand-like tool (transducer) will be moved over your chest. The gel will help to transmit the sound waves from the transducer.  The sound waves will harmlessly bounce off of your heart to allow the heart images to be captured in real-time motion. The images will be recorded on a computer. The procedure may  vary among health care providers and hospitals. What happens after the procedure?  You may return to your normal, everyday life, including diet, activities, and medicines, unless your health care provider tells you not to do that. Summary  An echocardiogram is a procedure that uses painless sound waves (ultrasound) to produce an image of the heart.  Images from an echocardiogram can provide important information about the size and shape of your heart, heart muscle function, heart valve function, and fluid buildup around your heart.  You do not need to do anything to prepare before this procedure. You may eat and drink normally.  After the echocardiogram is completed, you may return to your normal, everyday life, unless your health care provider tells you not to do that. This information is not intended to replace advice given to you by your health care provider. Make  sure you discuss any questions you have with your health care provider. Document Revised: 09/16/2018 Document Reviewed: 06/28/2016 Elsevier Patient Education  2020 [employer].      Adopting a Healthy Lifestyle.  Know what a healthy weight is for you (roughly BMI <25) and aim to maintain this   Aim for 7+ servings of fruits and vegetables daily   65-80+ fluid ounces of water or unsweet tea for healthy kidneys   Limit to max 1 drink of alcohol per day; avoid smoking/tobacco   Limit animal fats in diet for cholesterol and heart health - choose grass fed whenever available   Avoid highly processed foods, and foods high in saturated/trans fats   Aim for low stress - take time to unwind and care for your mental health   Aim for 150 min of moderate intensity exercise weekly for heart health, and weights twice weekly for bone health   Aim for 7-9 hours of sleep daily   When it comes to diets, agreement about the perfect plan isnt easy to find, even among the experts. Experts at the [facility] [employer] developed an idea known as the Healthy Eating Plate. Just imagine a plate divided into logical, healthy portions.   The emphasis is on diet quality:   Load up on vegetables and fruits - one-half of your plate: Aim for color and variety, and remember that potatoes dont count.   Go for whole grains - one-quarter of your plate: Whole wheat, barley, wheat berries, quinoa, oats, brown rice, and foods made with them. If you want pasta, go with whole wheat pasta.   Protein power - one-quarter of your plate: Fish, chicken, beans, and nuts are all healthy, versatile protein sources. Limit red meat.   The diet, however, does go beyond the plate, offering a few other suggestions.   Use healthy plant oils, such as olive, canola, soy, corn, sunflower and peanut. Check the labels, and avoid partially hydrogenated oil, which have unhealthy trans fats.   If youre thirsty, drink  water. Coffee and tea are good in moderation, but skip sugary drinks and limit milk and dairy products to one or two daily servings.   The type of carbohydrate in the diet is more important than the amount. Some sources of carbohydrates, such as vegetables, fruits, whole grains, and beans-are healthier than others.   Finally, stay active  Signed, Thomasene Ripple, DO  05/21/2020 11:17 AM    [facility]

## 2020-05-21 NOTE — Patient Instructions (Addendum)
Medication Instructions:  No medication changes. *If you need a refill on your cardiac medications before your next appointment, please call your pharmacy*   Lab Work: Your physician recommends that you have labs done in the office today. Your test included  basic metabolic panel, complete blood count, and vitamin D level.  If you have labs (blood work) drawn today and your tests are completely normal, you will receive your results only by: Marland Kitchen MyChart Message (if you have MyChart) OR . A paper copy in the mail If you have any lab test that is abnormal or we need to change your treatment, we will call you to review the results.   Testing/Procedures: Your physician has requested that you have an echocardiogram. Echocardiography is a painless test that uses sound waves to create images of your heart. It provides your doctor with information about the size and shape of your heart and how well your heart's chambers and valves are working. This procedure takes approximately one hour. There are no restrictions for this procedure.     [facility] CARDIOVASCULAR DIVISION [facility] AT [place] [address] [place] [address] Dept: [phone] Loc: [phone]  Windsor Goeken  05/21/2020  You are scheduled for a Cardiac Catheterization on Tuesday, December 21 with Dr. Lance Muss.  1. Please arrive at the [facility] (Main Entrance A) at [facility]: [address] [place], [place] [address] at 7:00 AM (This time is two hours before your procedure to ensure your preparation). Free valet parking service is available.   Special note: Every effort is made to have your procedure done on time. Please understand that emergencies sometimes delay scheduled procedures.  2. Diet: Do not eat solid foods after midnight.  The patient may have clear liquids until 5am upon the day of the procedure.  3. Labs: You had your labs done today in the office.  4.  Medication instructions in preparation for your procedure:   Contrast Allergy: No  Stop taking, Lasix (Furosemide)  Tuesday, December 21,  Do not take Diabetes Med Glucophage (Metformin) on the day of the procedure and HOLD 48 HOURS AFTER THE PROCEDURE.  On the morning of your procedure, take your Plavix/Clopidogrel and any morning medicines NOT listed above.  You may use sips of water.  5. Plan for one night stay--bring personal belongings. 6. Bring a current list of your medications and current insurance cards. 7. You MUST have a responsible person to drive you home. 8. Someone MUST be with you the first 24 hours after you arrive home or your discharge will be delayed. 9. Please wear clothes that are easy to get on and off and wear slip-on shoes.  Thank you for allowing [place] to care for you!   -- [facility]    Follow-Up: At [facility], you and your health needs are our priority.  As part of our continuing mission to provide you with exceptional heart care, we have created designated Provider Care Teams.  These Care Teams include your primary Cardiologist (physician) and Advanced Practice Providers (APPs -  Physician Assistants and Nurse Practitioners) who all work together to provide you with the care you need, when you need it.  We recommend signing up for the patient portal called "MyChart".  Sign up information is provided on this After Visit Summary.  MyChart is used to connect with patients for Virtual Visits (Telemedicine).  Patients are able to view lab/test results, encounter notes,  upcoming appointments, etc.  Non-urgent messages can be sent to your provider as well.   To learn more about what you can do with MyChart, go to [email or web address].    Your next appointment:   2 week(s)  The format for your next appointment:   In Person  Provider:   Thomasene Ripple, DO   Other Instructions  Coronary Angioplasty  Coronary angioplasty  is a procedure to widen a narrowed or blocked blood vessel of the heart (coronary artery). The artery is usually blocked by cholesterol buildup (plaques) in the lining of the artery walls. When a vessel in the heart becomes partially blocked, there is decreased blood flow to that area. This may lead to chest pain or a heart attack (myocardial infarction). Tell a health care provider about:  Any allergies you have, including allergies to shellfish or contrast dye.  All medicines you are taking, including vitamins, herbs, eye drops, creams, and over-the-counter medicines.  Any problems you or family members have had with anesthetic medicines.  Any blood disorders you have.  Any surgeries you have had.  Any medical conditions you have.  Whether you are pregnant or may be pregnant. What are the risks? Generally, this is a safe procedure. However, problems may occur, including:  Damage to other structures or organs. This may include damage to blood vessels, leading to rupture or bleeding.  Infection, bleeding, or bruising at the site where a small, thin tube (catheter) will be inserted.  Allergic reaction to the dye or contrast that is used.  Kidney damage from the dye or contrast that is used.  Blood clots that can lead to a stroke or heart attack.  Bleeding into the abdomen (retroperitoneal bleeding). What happens before the procedure? Staying hydrated Follow instructions from your health care provider about hydration, which may include:  Up to 2 hours before the procedure - you may continue to drink clear liquids, such as water, clear fruit juice, black coffee, and plain tea. Eating and drinking restrictions Follow instructions from your health care provider about eating and drinking, which may include:  8 hours before the procedure - stop eating heavy meals or foods such as meat, fried foods, or fatty foods.  6 hours before the procedure - stop eating light meals or foods, such  as toast or cereal.  2 hours before the procedure - stop drinking clear liquids. Medicines  Ask your health care provider about: ? Changing or stopping your regular medicines. This is especially important if you are taking diabetes medicines or blood thinners. ? Whether aspirin is recommended before this procedure.  Ask your health care provider if you can take a sip of water with any approved medicines the morning of the procedure. General instructions  Plan to have someone take you home from the hospital or clinic.  If you will be going home right after the procedure, plan to have someone with you for 24 hours. What happens during the procedure?  To reduce your risk of infection: ? Your health care team will wash or sanitize their hands. ? A germ-killing solution (antiseptic) will be used to wash the area where the catheter will be inserted. Hair may be removed from this area. The catheter may be inserted in:  Your groin area. This is the most common area.  The fold of your arm, near your elbow.  Your wrist.  An IV tube will be inserted into one of your veins.  You will be given a medicine to help you  relax (sedative).  You will be given a medicine to numb the area where the catheter will be inserted (local anesthetic).  The catheter will be inserted into an artery.  The catheter will be guided to the narrowed or blocked artery using a type of X-ray (fluoroscopy).  When the catheter is near the heart, dye will be injected that makes the narrowing or blockage visible on the X-ray.  Once the catheter is positioned at the narrowed or blocked portion of the blood vessel, a balloon will be inflated to make the artery wider. Expanding the balloon will crush the plaques into the wall of the vessel and improve the blood flow.  The artery may be made wider by removing plaques using a drill, laser, or other tools.  When the blood flow is better, the balloon will be deflated and the  catheter will be removed.  A stent may be placed. This is common in this procedure.  After the catheter is removed, a special dressing will be placed over the insertion site. What happens after the procedure?  You will need to keep the area still for a few hours, or as long as directed by your health care provider. If the procedure was done in the groin, you will be instructed not to bend or cross your legs.  The insertion site will be checked often.  The pulse in your feet or wrist will be checked often.  Additional blood tests, X-rays, and an electrocardiogram (ECG) may be done.  Do not drive for 24 hours if you were given a sedative. This information is not intended to replace advice given to you by your health care provider. Make sure you discuss any questions you have with your health care provider. Document Revised: 05/08/2017 Document Reviewed: 02/22/2016 Elsevier Patient Education  2020 [employer]  Echocardiogram An echocardiogram is a procedure that uses painless sound waves (ultrasound) to produce an image of the heart. Images from an echocardiogram can provide important information about:  Signs of coronary artery disease (CAD).  Aneurysm detection. An aneurysm is a weak or damaged part of an artery wall that bulges out from the normal force of blood pumping through the body.  Heart size and shape. Changes in the size or shape of the heart can be associated with certain conditions, including heart failure, aneurysm, and CAD.  Heart muscle function.  Heart valve function.  Signs of a past heart attack.  Fluid buildup around the heart.  Thickening of the heart muscle.  A tumor or infectious growth around the heart valves. Tell a health care provider about:  Any allergies you have.  All medicines you are taking, including vitamins, herbs, eye drops, creams, and over-the-counter medicines.  Any blood disorders you have.  Any surgeries you have had.  Any  medical conditions you have.  Whether you are pregnant or may be pregnant. What are the risks? Generally, this is a safe procedure. However, problems may occur, including:  Allergic reaction to dye (contrast) that may be used during the procedure. What happens before the procedure? No specific preparation is needed. You may eat and drink normally. What happens during the procedure?   An IV tube may be inserted into one of your veins.  You may receive contrast through this tube. A contrast is an injection that improves the quality of the pictures from your heart.  A gel will be applied to your chest.  A wand-like tool (transducer) will be moved over your chest. The gel  will help to transmit the sound waves from the transducer.  The sound waves will harmlessly bounce off of your heart to allow the heart images to be captured in real-time motion. The images will be recorded on a computer. The procedure may vary among health care providers and hospitals. What happens after the procedure?  You may return to your normal, everyday life, including diet, activities, and medicines, unless your health care provider tells you not to do that. Summary  An echocardiogram is a procedure that uses painless sound waves (ultrasound) to produce an image of the heart.  Images from an echocardiogram can provide important information about the size and shape of your heart, heart muscle function, heart valve function, and fluid buildup around your heart.  You do not need to do anything to prepare before this procedure. You may eat and drink normally.  After the echocardiogram is completed, you may return to your normal, everyday life, unless your health care provider tells you not to do that. This information is not intended to replace advice given to you by your health care provider. Make sure you discuss any questions you have with your health care provider. Document Revised: 09/16/2018 Document  Reviewed: 06/28/2016 Elsevier Patient Education  2020 [employer].

## 2020-05-21 NOTE — H&P (View-Only) (Signed)
Cardiology Office Note:    Date:  05/21/2020   ID:  Frederick Gomez, DOB 03/20/1961, MRN [id number]  PCP:  Mitchell, Meredith T, FNP  Cardiologist:  Detra Bores, DO  Electrophysiologist:  None   Referring MD: Mitchell, Meredith T, F*   " I am doing well"   History of Present Illness:    Frederick Gomez is a 59 y.o. male with a hx of hypertension, diabetes mellitus, hyperlipidemia, mild aortic stenosis, history of COVID-19 infection, history of TIA, obesity, OSA on CPAP at home presents today for follow-up visit.  I last the patient on September 26, 2019 at that time he reported that he had some shortness of breath.  At the conclusion of visit I did talk with the patient about taking his albuterol as prescribed and also increase his Lasix to 60 mg in the morning 40 in the afternoon.  I recommended the patient get an echocardiogram at that time to reassess his valvular abnormalities. Unfortunately he did not get his testing done.   He tells me over the last several months his shortness of breath has worsened on exertion.  And he really comes on with minimal exertion.  He is concerned as this is making him a lot measurable. He denies any chest pain.  Past Medical History:  Diagnosis Date  . Ascending aortic aneurysm (HCC) 03/30/2019  . COVID-19 virus infection 07/12/2019  . Diabetes (HCC)   . Essential hypertension 03/30/2019  . Hyperlipidemia   . Hypertension   . Mild aortic regurgitation 03/30/2019  . Mild aortic stenosis 03/30/2019  . Mixed hyperlipidemia 03/30/2019  . Obese   . Obesity (BMI 30-39.9) 03/30/2019  . OSA (obstructive sleep apnea) 04/01/2016  . OSA on CPAP   . Seasonal allergies   . TIA (transient ischemic attack)   . Type 2 diabetes mellitus without complication, without long-term current use of insulin (HCC) 03/30/2019    Past Surgical History:  Procedure Laterality Date  . CARDIAC CATHETERIZATION    . HEMORROIDECTOMY    . NOSE SURGERY      Current  Medications: Current Meds  Medication Sig  . albuterol (VENTOLIN HFA) 108 (90 Base) MCG/ACT inhaler Inhale 2 puffs into the lungs every 6 (six) hours as needed for wheezing or shortness of breath.  . carvedilol (COREG) 12.5 MG tablet Take 1 tablet (12.5 mg total) by mouth 2 (two) times daily.  . clopidogrel (PLAVIX) 75 MG tablet Take 75 mg by mouth daily.   . doxycycline (VIBRA-TABS) 100 MG tablet Take 100 mg by mouth 2 (two) times daily.  . furosemide (LASIX) 40 MG tablet Take 2.5 tablets (100 mg total) by mouth daily. Take 60 mg in the morning and 40 mg in the evening  . guaiFENesin-codeine 100-10 MG/5ML syrup Take by mouth.  . lovastatin (MEVACOR) 20 MG tablet Take 20 mg by mouth at bedtime.   . metFORMIN (GLUCOPHAGE) 850 MG tablet Take 850 mg by mouth 2 (two) times daily.  . polycarbophil (FIBERCON) 625 MG tablet Take 625 mg by mouth daily as needed (constipation.).   . Potassium Chloride ER 20 MEQ TBCR Take 1 tablet by mouth 2 (two) times daily.  . potassium chloride SA (KLOR-CON) 20 MEQ tablet Take 2 tablets (40 mEq total) by mouth daily.  . primidone (MYSOLINE) 250 MG tablet Take 250 mg by mouth daily as needed (tremor).   . Vitamin D, Ergocalciferol, (DRISDOL) 1.25 MG (50000 UNIT) CAPS capsule Take 50,000 Units by mouth once a week.       Allergies:   Hydrochlorothiazide, Niaspan [niacin], Pravastatin, Simvastatin, and Latex   Social History   Socioeconomic History  . Marital status: Single    Spouse name: Not on file  . Number of children: Not on file  . Years of education: Not on file  . Highest education level: Not on file  Occupational History  . Not on file  Tobacco Use  . Smoking status: Former Smoker    Years: 3.00    Types: Cigarettes    Quit date: 06/10/1979    Years since quitting: 40.9  . Smokeless tobacco: Never Used  Vaping Use  . Vaping Use: Never used  Substance and Sexual Activity  . Alcohol use: Never  . Drug use: Never  . Sexual activity: Not Currently   Other Topics Concern  . Not on file  Social History Narrative  . Not on file   Social Determinants of Health   Financial Resource Strain: Not on file  Food Insecurity: Not on file  Transportation Needs: Not on file  Physical Activity: Not on file  Stress: Not on file  Social Connections: Not on file     Family History: The patient's family history includes Diabetes in his mother; Heart attack in his mother; Suicidality in his father.  ROS:   Review of Systems  Constitution: Negative for decreased appetite, fever and weight gain.  HENT: Negative for congestion, ear discharge, hoarse voice and sore throat.   Eyes: Negative for discharge, redness, vision loss in right eye and visual halos.  Cardiovascular: Negative for chest pain, dyspnea on exertion, leg swelling, orthopnea and palpitations.  Respiratory: Negative for cough, hemoptysis, shortness of breath and snoring.   Endocrine: Negative for heat intolerance and polyphagia.  Hematologic/Lymphatic: Negative for bleeding problem. Does not bruise/bleed easily.  Skin: Negative for flushing, nail changes, rash and suspicious lesions.  Musculoskeletal: Negative for arthritis, joint pain, muscle cramps, myalgias, neck pain and stiffness.  Gastrointestinal: Negative for abdominal pain, bowel incontinence, diarrhea and excessive appetite.  Genitourinary: Negative for decreased libido, genital sores and incomplete emptying.  Neurological: Negative for brief paralysis, focal weakness, headaches and loss of balance.  Psychiatric/Behavioral: Negative for altered mental status, depression and suicidal ideas.  Allergic/Immunologic: Negative for HIV exposure and persistent infections.    EKGs/Labs/Other Studies Reviewed:    The following studies were reviewed today:   EKG:  The ekg ordered today demonstrates sinus rhythm, heart rate 70 bpm with left atrial enlargement and poor R wave progression in the precordial leads cannot rule out  anterior infarction of age indeterminate, compared to prior EKG precordial leads changes are new.  Recent Labs: 07/16/2019: ALT 34; B Natriuretic Peptide 129.3 07/17/2019: Hemoglobin 15.0; Platelets 305 09/26/2019: BUN 10; Creatinine, Ser 0.99; Magnesium 2.1; NT-Pro BNP 135; Potassium 4.5; Sodium 140  Recent Lipid Panel    Component Value Date/Time   CHOL 175 03/29/2019 0858   TRIG 108 03/29/2019 0858   HDL 37 (L) 03/29/2019 0858   CHOLHDL 4.7 03/29/2019 0858   LDLCALC 118 (H) 03/29/2019 0858    Physical Exam:    VS:  BP 140/80   Pulse 79   Ht 5' 8" (1.727 m)   Wt 247 lb 3.2 oz (112.1 kg)   SpO2 98%   BMI 37.59 kg/m     Wt Readings from Last 3 Encounters:  05/21/20 247 lb 3.2 oz (112.1 kg)  09/26/19 246 lb (111.6 kg)  08/25/19 241 lb (109.3 kg)     GEN: Well nourished, well developed in   no acute distress HEENT: Normal NECK: No JVD; No carotid bruits LYMPHATICS: No lymphadenopathy CARDIAC: S1S2 noted,RRR, 2/6 systolic murmurs, rubs, gallops RESPIRATORY:  Clear to auscultation without rales, wheezing or rhonchi  ABDOMEN: Soft, non-tender, non-distended, +bowel sounds, no guarding. EXTREMITIES:bilateral ankle edema, No cyanosis, no clubbing MUSCULOSKELETAL:  No deformity  SKIN: Warm and dry NEUROLOGIC:  Alert and oriented x 3, non-focal PSYCHIATRIC:  Normal affect, good insight  ASSESSMENT:    1. Dyspnea on exertion   2. Abnormal EKG   3. Ascending aortic aneurysm (HCC)   4. Essential hypertension   5. Mild aortic stenosis   6. Mild aortic regurgitation   7. OSA on CPAP   8. Type 2 diabetes mellitus without complication, without long-term current use of insulin (HCC)   9. Mixed hyperlipidemia   10. Obesity (BMI 30-39.9)   11. Vitamin D deficiency    PLAN:    His worsening symptoms is concerning I do suspect that his shortness of breath is his anginal equivalent.  And he has had now a slight change on his EKG which I like to proceed with an ischemic evaluation.  He  is a high risk patient with symptoms and I like to proceed with a right and left heart catheterization in this gentleman.  We discussed the heart catheterization and explained the testing to him, the patient understands that risks include but are not limited to stroke (1 in 1000), death (1 in 1000), kidney failure [usually temporary] (1 in 500), bleeding (1 in 200), allergic reaction [possibly serious] (1 in 200), and agrees to proceed.  Hypertension-he said his blood pressure is otherwise good control he has not taken his medication this morning his blood pressure is acceptable.  We will continue his current medication regimen.  Hyperlipidemia continue his lovastatin.  Obstructive sleep apnea-he has a CPAP which back in April he told me he had been following up at that time I discussed with the patient that he needed to talk to his PCP for having a repeat study or reordering a new mask which this had not been done.  Obesity-the patient understands the need to lose weight with diet and exercise. We have discussed specific strategies for this.  I also do think that his shortness of breath may be a factor of pulmonary component and I have referred the patient to see pulmonary as well.  He has questions about writing a note for his work and we were able to do that today.  He plans to apply for FMLA as well.  Blood work will be done today which will include CBC, BMP, vitamin D  The patient is in agreement with the above plan. The patient left the office in stable condition.  The patient will follow up in 2 weeks post heart catheterization.   Medication Adjustments/Labs and Tests Ordered: Current medicines are reviewed at length with the patient today.  Concerns regarding medicines are outlined above.  Orders Placed This Encounter  Procedures  . Basic metabolic panel  . CBC with Differential/Platelet  . VITAMIN D 25 Hydroxy (Vit-D Deficiency, Fractures)  . Ambulatory referral to Pulmonology  .  EKG 12-Lead   No orders of the defined types were placed in this encounter.   Patient Instructions  Medication Instructions:  No medication changes. *If you need a refill on your cardiac medications before your next appointment, please call your pharmacy*   Lab Work: Your physician recommends that you have labs done in the office today. Your test included    basic metabolic panel, complete blood count, and vitamin D level.  If you have labs (blood work) drawn today and your tests are completely normal, you will receive your results only by: . MyChart Message (if you have MyChart) OR . A paper copy in the mail If you have any lab test that is abnormal or we need to change your treatment, we will call you to review the results.   Testing/Procedures: Your physician has requested that you have an echocardiogram. Echocardiography is a painless test that uses sound waves to create images of your heart. It provides your doctor with information about the size and shape of your heart and how well your heart's chambers and valves are working. This procedure takes approximately one hour. There are no restrictions for this procedure.     [facility] [address] [place] [address] Dept: [phone] Loc: [phone]  Rajeev Behrle  05/21/2020  You are scheduled for a Cardiac Catheterization on Tuesday, December 21 with Dr. Jayadeep Varanasi.  1. Please arrive at the [facility] (Main Entrance A) at [facility]: [address] [place], [place] [address] at 7:00 AM (This time is two hours before your procedure to ensure your preparation). Free valet parking service is available.   Special note: Every effort is made to have your procedure done on time. Please understand that emergencies sometimes delay scheduled procedures.  2. Diet: Do not eat solid foods after midnight.  The patient may have clear  liquids until 5am upon the day of the procedure.  3. Labs: You had your labs done today in the office.  4. Medication instructions in preparation for your procedure:   Contrast Allergy: No  Stop taking, Lasix (Furosemide)  Tuesday, December 21,  Do not take Diabetes Med Glucophage (Metformin) on the day of the procedure and HOLD 48 HOURS AFTER THE PROCEDURE.  On the morning of your procedure, take your Plavix/Clopidogrel and any morning medicines NOT listed above.  You may use sips of water.  5. Plan for one night stay--bring personal belongings. 6. Bring a current list of your medications and current insurance cards. 7. You MUST have a responsible person to drive you home. 8. Someone MUST be with you the first 24 hours after you arrive home or your discharge will be delayed. 9. Please wear clothes that are easy to get on and off and wear slip-on shoes.  Thank you for allowing us to care for you!   -- [facility]    Follow-Up: At [facility], you and your health needs are our priority.  As part of our continuing mission to provide you with exceptional heart care, we have created designated Provider Care Teams.  These Care Teams include your primary Cardiologist (physician) and Advanced Practice Providers (APPs -  Physician Assistants and Nurse Practitioners) who all work together to provide you with the care you need, when you need it.  We recommend signing up for the patient portal called "MyChart".  Sign up information is provided on this After Visit Summary.  MyChart is used to connect with patients for Virtual Visits (Telemedicine).  Patients are able to view lab/test results, encounter notes, upcoming appointments, etc.  Non-urgent messages can be sent to your provider as well.   To learn more about what you can do with MyChart, go to [email or web address].    Your next appointment:   2 week(s)  The format for   your next appointment:   In  Person  Provider:   Deondra Labrador, DO   Other Instructions  Coronary Angioplasty  Coronary angioplasty is a procedure to widen a narrowed or blocked blood vessel of the heart (coronary artery). The artery is usually blocked by cholesterol buildup (plaques) in the lining of the artery walls. When a vessel in the heart becomes partially blocked, there is decreased blood flow to that area. This may lead to chest pain or a heart attack (myocardial infarction). Tell a health care provider about:  Any allergies you have, including allergies to shellfish or contrast dye.  All medicines you are taking, including vitamins, herbs, eye drops, creams, and over-the-counter medicines.  Any problems you or family members have had with anesthetic medicines.  Any blood disorders you have.  Any surgeries you have had.  Any medical conditions you have.  Whether you are pregnant or may be pregnant. What are the risks? Generally, this is a safe procedure. However, problems may occur, including:  Damage to other structures or organs. This may include damage to blood vessels, leading to rupture or bleeding.  Infection, bleeding, or bruising at the site where a small, thin tube (catheter) will be inserted.  Allergic reaction to the dye or contrast that is used.  Kidney damage from the dye or contrast that is used.  Blood clots that can lead to a stroke or heart attack.  Bleeding into the abdomen (retroperitoneal bleeding). What happens before the procedure? Staying hydrated Follow instructions from your health care provider about hydration, which may include:  Up to 2 hours before the procedure - you may continue to drink clear liquids, such as water, clear fruit juice, black coffee, and plain tea. Eating and drinking restrictions Follow instructions from your health care provider about eating and drinking, which may include:  8 hours before the procedure - stop eating heavy meals or foods such  as meat, fried foods, or fatty foods.  6 hours before the procedure - stop eating light meals or foods, such as toast or cereal.  2 hours before the procedure - stop drinking clear liquids. Medicines  Ask your health care provider about: ? Changing or stopping your regular medicines. This is especially important if you are taking diabetes medicines or blood thinners. ? Whether aspirin is recommended before this procedure.  Ask your health care provider if you can take a sip of water with any approved medicines the morning of the procedure. General instructions  Plan to have someone take you home from the hospital or clinic.  If you will be going home right after the procedure, plan to have someone with you for 24 hours. What happens during the procedure?  To reduce your risk of infection: ? Your health care team will wash or sanitize their hands. ? A germ-killing solution (antiseptic) will be used to wash the area where the catheter will be inserted. Hair may be removed from this area. The catheter may be inserted in:  Your groin area. This is the most common area.  The fold of your arm, near your elbow.  Your wrist.  An IV tube will be inserted into one of your veins.  You will be given a medicine to help you relax (sedative).  You will be given a medicine to numb the area where the catheter will be inserted (local anesthetic).  The catheter will be inserted into an artery.  The catheter will be guided to the narrowed or blocked artery using   a type of X-ray (fluoroscopy).  When the catheter is near the heart, dye will be injected that makes the narrowing or blockage visible on the X-ray.  Once the catheter is positioned at the narrowed or blocked portion of the blood vessel, a balloon will be inflated to make the artery wider. Expanding the balloon will crush the plaques into the wall of the vessel and improve the blood flow.  The artery may be made wider by removing  plaques using a drill, laser, or other tools.  When the blood flow is better, the balloon will be deflated and the catheter will be removed.  A stent may be placed. This is common in this procedure.  After the catheter is removed, a special dressing will be placed over the insertion site. What happens after the procedure?  You will need to keep the area still for a few hours, or as long as directed by your health care provider. If the procedure was done in the groin, you will be instructed not to bend or cross your legs.  The insertion site will be checked often.  The pulse in your feet or wrist will be checked often.  Additional blood tests, X-rays, and an electrocardiogram (ECG) may be done.  Do not drive for 24 hours if you were given a sedative. This information is not intended to replace advice given to you by your health care provider. Make sure you discuss any questions you have with your health care provider. Document Revised: 05/08/2017 Document Reviewed: 02/22/2016 Elsevier Patient Education  2020 Elsevier Inc.  Echocardiogram An echocardiogram is a procedure that uses painless sound waves (ultrasound) to produce an image of the heart. Images from an echocardiogram can provide important information about:  Signs of coronary artery disease (CAD).  Aneurysm detection. An aneurysm is a weak or damaged part of an artery wall that bulges out from the normal force of blood pumping through the body.  Heart size and shape. Changes in the size or shape of the heart can be associated with certain conditions, including heart failure, aneurysm, and CAD.  Heart muscle function.  Heart valve function.  Signs of a past heart attack.  Fluid buildup around the heart.  Thickening of the heart muscle.  A tumor or infectious growth around the heart valves. Tell a health care provider about:  Any allergies you have.  All medicines you are taking, including vitamins, herbs, eye  drops, creams, and over-the-counter medicines.  Any blood disorders you have.  Any surgeries you have had.  Any medical conditions you have.  Whether you are pregnant or may be pregnant. What are the risks? Generally, this is a safe procedure. However, problems may occur, including:  Allergic reaction to dye (contrast) that may be used during the procedure. What happens before the procedure? No specific preparation is needed. You may eat and drink normally. What happens during the procedure?   An IV tube may be inserted into one of your veins.  You may receive contrast through this tube. A contrast is an injection that improves the quality of the pictures from your heart.  A gel will be applied to your chest.  A wand-like tool (transducer) will be moved over your chest. The gel will help to transmit the sound waves from the transducer.  The sound waves will harmlessly bounce off of your heart to allow the heart images to be captured in real-time motion. The images will be recorded on a computer. The procedure may   vary among health care providers and hospitals. What happens after the procedure?  You may return to your normal, everyday life, including diet, activities, and medicines, unless your health care provider tells you not to do that. Summary  An echocardiogram is a procedure that uses painless sound waves (ultrasound) to produce an image of the heart.  Images from an echocardiogram can provide important information about the size and shape of your heart, heart muscle function, heart valve function, and fluid buildup around your heart.  You do not need to do anything to prepare before this procedure. You may eat and drink normally.  After the echocardiogram is completed, you may return to your normal, everyday life, unless your health care provider tells you not to do that. This information is not intended to replace advice given to you by your health care provider. Make  sure you discuss any questions you have with your health care provider. Document Revised: 09/16/2018 Document Reviewed: 06/28/2016 Elsevier Patient Education  2020 Elsevier Inc.      Adopting a Healthy Lifestyle.  Know what a healthy weight is for you (roughly BMI <25) and aim to maintain this   Aim for 7+ servings of fruits and vegetables daily   65-80+ fluid ounces of water or unsweet tea for healthy kidneys   Limit to max 1 drink of alcohol per day; avoid smoking/tobacco   Limit animal fats in diet for cholesterol and heart health - choose grass fed whenever available   Avoid highly processed foods, and foods high in saturated/trans fats   Aim for low stress - take time to unwind and care for your mental health   Aim for 150 min of moderate intensity exercise weekly for heart health, and weights twice weekly for bone health   Aim for 7-9 hours of sleep daily   When it comes to diets, agreement about the perfect plan isnt easy to find, even among the experts. Experts at the [employer] developed an idea known as the Healthy Eating Plate. Just imagine a plate divided into logical, healthy portions.   The emphasis is on diet quality:   Load up on vegetables and fruits - one-half of your plate: Aim for color and variety, and remember that potatoes dont count.   Go for whole grains - one-quarter of your plate: Whole wheat, barley, wheat berries, quinoa, oats, brown rice, and foods made with them. If you want pasta, go with whole wheat pasta.   Protein power - one-quarter of your plate: Fish, chicken, beans, and nuts are all healthy, versatile protein sources. Limit red meat.   The diet, however, does go beyond the plate, offering a few other suggestions.   Use healthy plant oils, such as olive, canola, soy, corn, sunflower and peanut. Check the labels, and avoid partially hydrogenated oil, which have unhealthy trans fats.   If youre thirsty, drink  water. Coffee and tea are good in moderation, but skip sugary drinks and limit milk and dairy products to one or two daily servings.   The type of carbohydrate in the diet is more important than the amount. Some sources of carbohydrates, such as vegetables, fruits, whole grains, and beans-are healthier than others.   Finally, stay active  Signed, Kolette Vey, DO  05/21/2020 11:17 AM    [facility] 

## 2020-05-21 NOTE — Addendum Note (Signed)
Addended by: Thomasene Ripple on: 05/21/2020 11:39 AM   Modules accepted: Orders, SmartSet

## 2020-05-22 ENCOUNTER — Telehealth: Payer: Self-pay

## 2020-05-22 LAB — CBC WITH DIFFERENTIAL/PLATELET
Basophils Absolute: 0 10*3/uL (ref 0.0–0.2)
Basos: 0 %
EOS (ABSOLUTE): 0.1 10*3/uL (ref 0.0–0.4)
Eos: 2 %
Hematocrit: 48.1 % (ref 37.5–51.0)
Hemoglobin: 16.3 g/dL (ref 13.0–17.7)
Immature Grans (Abs): 0.1 10*3/uL (ref 0.0–0.1)
Immature Granulocytes: 1 %
Lymphocytes Absolute: 0.6 10*3/uL — ABNORMAL LOW (ref 0.7–3.1)
Lymphs: 8 %
MCH: 29 pg (ref 26.6–33.0)
MCHC: 33.9 g/dL (ref 31.5–35.7)
MCV: 85 fL (ref 79–97)
Monocytes Absolute: 0.5 10*3/uL (ref 0.1–0.9)
Monocytes: 6 %
Neutrophils Absolute: 7 10*3/uL (ref 1.4–7.0)
Neutrophils: 83 %
Platelets: 223 10*3/uL (ref 150–450)
RBC: 5.63 x10E6/uL (ref 4.14–5.80)
RDW: 13.4 % (ref 11.6–15.4)
WBC: 8.4 10*3/uL (ref 3.4–10.8)

## 2020-05-22 LAB — BASIC METABOLIC PANEL
BUN/Creatinine Ratio: 16 (ref 9–20)
BUN: 15 mg/dL (ref 6–24)
CO2: 25 mmol/L (ref 20–29)
Calcium: 9.2 mg/dL (ref 8.7–10.2)
Chloride: 103 mmol/L (ref 96–106)
Creatinine, Ser: 0.96 mg/dL (ref 0.76–1.27)
GFR calc Af Amer: 101 mL/min/{1.73_m2} (ref >59)
GFR calc non Af Amer: 87 mL/min/{1.73_m2} (ref >59)
Glucose: 142 mg/dL — ABNORMAL HIGH (ref 65–99)
Potassium: 4.4 mmol/L (ref 3.5–5.2)
Sodium: 142 mmol/L (ref 134–144)

## 2020-05-22 LAB — VITAMIN D 25 HYDROXY (VIT D DEFICIENCY, FRACTURES): Vit D, 25-Hydroxy: 13.6 ng/mL — ABNORMAL LOW (ref 30.0–100.0)

## 2020-05-22 NOTE — Telephone Encounter (Signed)
Left message on patients voicemail to please return our call.   

## 2020-05-22 NOTE — Telephone Encounter (Signed)
-----   Message from Kardie Tobb, DO sent at 05/22/2020  9:30 AM EST ----- Your labs show evidence of vitamin D deficiency which could be causing you to be fatigue.  We will like to do is start you on vitamin D 50,000 units once weekly for the next 12 weeks. 

## 2020-05-23 ENCOUNTER — Telehealth: Payer: Self-pay

## 2020-05-23 ENCOUNTER — Other Ambulatory Visit: Payer: Self-pay

## 2020-05-23 ENCOUNTER — Ambulatory Visit (HOSPITAL_BASED_OUTPATIENT_CLINIC_OR_DEPARTMENT_OTHER)
Admission: RE | Admit: 2020-05-23 | Discharge: 2020-05-23 | Disposition: A | Discharge status: Home / Self Care | Payer: BC Managed Care – PPO | Source: Ambulatory Visit | Attending: Cardiology | Admitting: Cardiology

## 2020-05-23 DIAGNOSIS — I350 Nonrheumatic aortic (valve) stenosis: Secondary | ICD-10-CM | POA: Diagnosis not present

## 2020-05-23 DIAGNOSIS — R0602 Shortness of breath: Secondary | ICD-10-CM | POA: Diagnosis present

## 2020-05-23 LAB — ECHOCARDIOGRAM COMPLETE
AR max vel: 1.59 cm2
AV Area VTI: 1.63 cm2
AV Area mean vel: 1.7 cm2
AV Mean grad: 13 mmHg
AV Peak grad: 24.5 mmHg
Ao pk vel: 2.48 m/s
Area-P 1/2: 2.66 cm2
P 1/2 time: 388 msec
S' Lateral: 2.81 cm

## 2020-05-23 MED ORDER — VITAMIN D (ERGOCALCIFEROL) 1.25 MG (50000 UNIT) PO CAPS: 50000.0000 [IU] | ORAL_CAPSULE | ORAL | 0 refills | Status: AC

## 2020-05-23 NOTE — Telephone Encounter (Signed)
-----   Message from Thomasene Ripple, DO sent at 05/23/2020  1:41 PM EST ----- Your ejection fraction was normal.  Your ascending aorta is still about the same has not increased in size.

## 2020-05-23 NOTE — Telephone Encounter (Signed)
-----   Message from Thomasene Ripple, DO sent at 05/22/2020  9:30 AM EST ----- Your labs show evidence of vitamin D deficiency which could be causing you to be fatigue.  We will like to do is start you on vitamin D 50,000 units once weekly for the next 12 weeks.

## 2020-05-23 NOTE — Telephone Encounter (Signed)
The patient has been notified of the result and verbalized understanding.  All questions (if any) were answered. Leanord Hawking, RN 05/23/2020 1:47 PM

## 2020-05-23 NOTE — Telephone Encounter (Signed)
Spoke with patient regarding results and recommendation.  Patient verbalizes understanding and is agreeable to plan of care. Advised patient to call back with any issues or concerns.  

## 2020-05-26 ENCOUNTER — Other Ambulatory Visit (HOSPITAL_COMMUNITY)
Admission: RE | Admit: 2020-05-26 | Discharge: 2020-05-26 | Disposition: A | Discharge status: Home / Self Care | Payer: BC Managed Care – PPO | Source: Ambulatory Visit | Attending: Interventional Cardiology | Admitting: Interventional Cardiology

## 2020-05-26 DIAGNOSIS — I712 Thoracic aortic aneurysm, without rupture: Secondary | ICD-10-CM | POA: Diagnosis not present

## 2020-05-26 DIAGNOSIS — Z20822 Contact with and (suspected) exposure to covid-19: Secondary | ICD-10-CM | POA: Insufficient documentation

## 2020-05-26 DIAGNOSIS — Z01812 Encounter for preprocedural laboratory examination: Secondary | ICD-10-CM | POA: Insufficient documentation

## 2020-05-26 DIAGNOSIS — I10 Essential (primary) hypertension: Secondary | ICD-10-CM | POA: Diagnosis not present

## 2020-05-26 DIAGNOSIS — R0600 Dyspnea, unspecified: Secondary | ICD-10-CM | POA: Diagnosis present

## 2020-05-26 DIAGNOSIS — R9431 Abnormal electrocardiogram [ECG] [EKG]: Secondary | ICD-10-CM | POA: Diagnosis not present

## 2020-05-26 DIAGNOSIS — E782 Mixed hyperlipidemia: Secondary | ICD-10-CM | POA: Diagnosis not present

## 2020-05-26 DIAGNOSIS — I2510 Atherosclerotic heart disease of native coronary artery without angina pectoris: Secondary | ICD-10-CM | POA: Diagnosis not present

## 2020-05-26 DIAGNOSIS — G4733 Obstructive sleep apnea (adult) (pediatric): Secondary | ICD-10-CM | POA: Diagnosis not present

## 2020-05-26 DIAGNOSIS — Z79899 Other long term (current) drug therapy: Secondary | ICD-10-CM | POA: Diagnosis not present

## 2020-05-26 DIAGNOSIS — Z6837 Body mass index (BMI) 37.0-37.9, adult: Secondary | ICD-10-CM | POA: Diagnosis not present

## 2020-05-26 DIAGNOSIS — Z8249 Family history of ischemic heart disease and other diseases of the circulatory system: Secondary | ICD-10-CM | POA: Diagnosis not present

## 2020-05-26 DIAGNOSIS — Z7984 Long term (current) use of oral hypoglycemic drugs: Secondary | ICD-10-CM | POA: Diagnosis not present

## 2020-05-26 DIAGNOSIS — E119 Type 2 diabetes mellitus without complications: Secondary | ICD-10-CM | POA: Diagnosis not present

## 2020-05-26 DIAGNOSIS — E669 Obesity, unspecified: Secondary | ICD-10-CM | POA: Diagnosis not present

## 2020-05-26 DIAGNOSIS — Z7902 Long term (current) use of antithrombotics/antiplatelets: Secondary | ICD-10-CM | POA: Diagnosis not present

## 2020-05-26 DIAGNOSIS — Z87891 Personal history of nicotine dependence: Secondary | ICD-10-CM | POA: Diagnosis not present

## 2020-05-26 DIAGNOSIS — Z8673 Personal history of transient ischemic attack (TIA), and cerebral infarction without residual deficits: Secondary | ICD-10-CM | POA: Diagnosis not present

## 2020-05-26 DIAGNOSIS — I350 Nonrheumatic aortic (valve) stenosis: Secondary | ICD-10-CM | POA: Diagnosis not present

## 2020-05-26 DIAGNOSIS — Z8616 Personal history of COVID-19: Secondary | ICD-10-CM | POA: Diagnosis not present

## 2020-05-26 LAB — SARS CORONAVIRUS 2 (TAT 6-24 HRS): SARS Coronavirus 2: NEGATIVE

## 2020-05-28 ENCOUNTER — Telehealth: Payer: Self-pay | Admitting: *Deleted

## 2020-05-28 NOTE — Telephone Encounter (Addendum)
Pt contacted pre-catheterization scheduled at [facility] for: Tuesday May 29, 2020 9 AM Verified arrival time and place: [facility] Main Entrance A [facility]) at: 7 AM   No solid food after midnight prior to cath, clear liquids until 5 AM day of procedure.  Hold: Metformin-day of procedure and 48 hours post procedure Lasix/KCl-AM of procedure  Except hold medications AM meds can be  taken pre-cath with sips of water including: ASA 81 mg Plavix 75 mg   Confirmed patient has responsible adult to drive home post procedure and be with patient first 24 hours after arriving home: yes  You are allowed ONE visitor in the waiting room during the time you are at the hospital for your procedure. Both you and your visitor must wear a mask once you enter the hospital.       COVID-19 Pre-Screening Questions:  . In the past 14 days have you had any symptoms concerning for COVID-19 infection (fever, chills, cough, or new shortness of breath)? no . In the past 14 days have you been around anyone with known Covid 19? no    Reviewed procedure/mask/visitor instructions, COVID-19 questions with patient.

## 2020-05-29 ENCOUNTER — Encounter (HOSPITAL_COMMUNITY): Payer: Self-pay | Admitting: Interventional Cardiology

## 2020-05-29 ENCOUNTER — Ambulatory Visit (HOSPITAL_COMMUNITY)
Admission: RE | Admit: 2020-05-29 | Discharge: 2020-05-29 | Disposition: A | Discharge status: Home / Self Care | Payer: BC Managed Care – PPO | Attending: Interventional Cardiology | Admitting: Interventional Cardiology

## 2020-05-29 ENCOUNTER — Ambulatory Visit (HOSPITAL_COMMUNITY)
Admission: RE | Disposition: A | Discharge status: Home / Self Care | Payer: Self-pay | Source: Home / Self Care | Attending: Interventional Cardiology

## 2020-05-29 ENCOUNTER — Other Ambulatory Visit: Payer: Self-pay

## 2020-05-29 DIAGNOSIS — I351 Nonrheumatic aortic (valve) insufficiency: Secondary | ICD-10-CM

## 2020-05-29 DIAGNOSIS — I712 Thoracic aortic aneurysm, without rupture: Secondary | ICD-10-CM | POA: Insufficient documentation

## 2020-05-29 DIAGNOSIS — R0600 Dyspnea, unspecified: Secondary | ICD-10-CM | POA: Diagnosis not present

## 2020-05-29 DIAGNOSIS — Z87891 Personal history of nicotine dependence: Secondary | ICD-10-CM | POA: Insufficient documentation

## 2020-05-29 DIAGNOSIS — Z7984 Long term (current) use of oral hypoglycemic drugs: Secondary | ICD-10-CM | POA: Insufficient documentation

## 2020-05-29 DIAGNOSIS — G4733 Obstructive sleep apnea (adult) (pediatric): Secondary | ICD-10-CM | POA: Insufficient documentation

## 2020-05-29 DIAGNOSIS — Z8616 Personal history of COVID-19: Secondary | ICD-10-CM | POA: Insufficient documentation

## 2020-05-29 DIAGNOSIS — Z7902 Long term (current) use of antithrombotics/antiplatelets: Secondary | ICD-10-CM | POA: Insufficient documentation

## 2020-05-29 DIAGNOSIS — R9431 Abnormal electrocardiogram [ECG] [EKG]: Secondary | ICD-10-CM | POA: Insufficient documentation

## 2020-05-29 DIAGNOSIS — Z20822 Contact with and (suspected) exposure to covid-19: Secondary | ICD-10-CM | POA: Insufficient documentation

## 2020-05-29 DIAGNOSIS — Z8249 Family history of ischemic heart disease and other diseases of the circulatory system: Secondary | ICD-10-CM | POA: Insufficient documentation

## 2020-05-29 DIAGNOSIS — E782 Mixed hyperlipidemia: Secondary | ICD-10-CM | POA: Insufficient documentation

## 2020-05-29 DIAGNOSIS — Z6837 Body mass index (BMI) 37.0-37.9, adult: Secondary | ICD-10-CM | POA: Insufficient documentation

## 2020-05-29 DIAGNOSIS — E669 Obesity, unspecified: Secondary | ICD-10-CM | POA: Insufficient documentation

## 2020-05-29 DIAGNOSIS — I2510 Atherosclerotic heart disease of native coronary artery without angina pectoris: Secondary | ICD-10-CM | POA: Diagnosis not present

## 2020-05-29 DIAGNOSIS — Z8673 Personal history of transient ischemic attack (TIA), and cerebral infarction without residual deficits: Secondary | ICD-10-CM | POA: Insufficient documentation

## 2020-05-29 DIAGNOSIS — I10 Essential (primary) hypertension: Secondary | ICD-10-CM | POA: Insufficient documentation

## 2020-05-29 DIAGNOSIS — I350 Nonrheumatic aortic (valve) stenosis: Secondary | ICD-10-CM | POA: Insufficient documentation

## 2020-05-29 DIAGNOSIS — E119 Type 2 diabetes mellitus without complications: Secondary | ICD-10-CM | POA: Insufficient documentation

## 2020-05-29 DIAGNOSIS — Z79899 Other long term (current) drug therapy: Secondary | ICD-10-CM | POA: Insufficient documentation

## 2020-05-29 HISTORY — PX: RIGHT/LEFT HEART CATH AND CORONARY ANGIOGRAPHY: CATH118266

## 2020-05-29 LAB — POCT I-STAT EG7
Acid-Base Excess: 2 mmol/L (ref 0.0–2.0)
Bicarbonate: 29.1 mmol/L — ABNORMAL HIGH (ref 20.0–28.0)
Calcium, Ion: 1.28 mmol/L (ref 1.15–1.40)
HCT: 46 % (ref 39.0–52.0)
Hemoglobin: 15.6 g/dL (ref 13.0–17.0)
O2 Saturation: 71 %
Potassium: 4.2 mmol/L (ref 3.5–5.1)
Sodium: 142 mmol/L (ref 135–145)
TCO2: 31 mmol/L (ref 22–32)
pCO2, Ven: 54 mmHg (ref 44.0–60.0)
pH, Ven: 7.339 (ref 7.250–7.430)
pO2, Ven: 41 mmHg (ref 32.0–45.0)

## 2020-05-29 LAB — POCT I-STAT 7, (LYTES, BLD GAS, ICA,H+H)
Acid-Base Excess: 1 mmol/L (ref 0.0–2.0)
Bicarbonate: 27.4 mmol/L (ref 20.0–28.0)
Calcium, Ion: 1.24 mmol/L (ref 1.15–1.40)
HCT: 45 % (ref 39.0–52.0)
Hemoglobin: 15.3 g/dL (ref 13.0–17.0)
O2 Saturation: 99 %
Potassium: 4.2 mmol/L (ref 3.5–5.1)
Sodium: 142 mmol/L (ref 135–145)
TCO2: 29 mmol/L (ref 22–32)
pCO2 arterial: 47.7 mmHg (ref 32.0–48.0)
pH, Arterial: 7.367 (ref 7.350–7.450)
pO2, Arterial: 128 mmHg — ABNORMAL HIGH (ref 83.0–108.0)

## 2020-05-29 LAB — GLUCOSE, CAPILLARY
Glucose-Capillary: 151 mg/dL — ABNORMAL HIGH (ref 70–99)
Glucose-Capillary: 137 mg/dL — ABNORMAL HIGH (ref 70–99)

## 2020-05-29 SURGERY — RIGHT/LEFT HEART CATH AND CORONARY ANGIOGRAPHY
Anesthesia: LOCAL

## 2020-05-29 MED ORDER — METOPROLOL TARTRATE 5 MG/5ML IV SOLN
INTRAVENOUS | Status: AC
  Filled 2020-05-29: qty 5

## 2020-05-29 MED ORDER — IOHEXOL 350 MG/ML SOLN
INTRAVENOUS | Status: DC | PRN
  Administered 2020-05-29: 10:00:00 55 mL

## 2020-05-29 MED ORDER — HYDRALAZINE HCL 20 MG/ML IJ SOLN: 10.0000 mg | INTRAMUSCULAR | Status: DC | PRN

## 2020-05-29 MED ORDER — VERAPAMIL HCL 2.5 MG/ML IV SOLN
INTRAVENOUS | Status: AC
  Filled 2020-05-29: qty 2

## 2020-05-29 MED ORDER — VERAPAMIL HCL 2.5 MG/ML IV SOLN
INTRAVENOUS | Status: DC | PRN
  Administered 2020-05-29: 09:00:00 10 mL via INTRA_ARTERIAL

## 2020-05-29 MED ORDER — HYDRALAZINE HCL 20 MG/ML IJ SOLN
INTRAMUSCULAR | Status: AC
  Filled 2020-05-29: qty 1

## 2020-05-29 MED ORDER — SODIUM CHLORIDE 0.9 % IV SOLN: 250.0000 mL | INTRAVENOUS | Status: DC | PRN

## 2020-05-29 MED ORDER — SODIUM CHLORIDE 0.9% FLUSH: 3.0000 mL | INTRAVENOUS | Status: DC | PRN

## 2020-05-29 MED ORDER — LABETALOL HCL 5 MG/ML IV SOLN: 10.0000 mg | INTRAVENOUS | Status: DC | PRN

## 2020-05-29 MED ORDER — LIDOCAINE HCL (PF) 1 % IJ SOLN
INTRAMUSCULAR | Status: AC
  Filled 2020-05-29: qty 30

## 2020-05-29 MED ORDER — HEPARIN (PORCINE) IN NACL 1000-0.9 UT/500ML-% IV SOLN
INTRAVENOUS | Status: DC | PRN
  Administered 2020-05-29 (×2): 500 mL

## 2020-05-29 MED ORDER — METOPROLOL TARTRATE 5 MG/5ML IV SOLN
INTRAVENOUS | Status: DC | PRN
  Administered 2020-05-29: 5 mg via INTRAVENOUS

## 2020-05-29 MED ORDER — SODIUM CHLORIDE 0.9% FLUSH: 3.0000 mL | Freq: Two times a day (BID) | INTRAVENOUS | Status: DC

## 2020-05-29 MED ORDER — SODIUM CHLORIDE 0.9 % WEIGHT BASED INFUSION: 1.0000 mL/kg/h | INTRAVENOUS | Status: DC

## 2020-05-29 MED ORDER — MIDAZOLAM HCL 2 MG/2ML IJ SOLN
INTRAMUSCULAR | Status: DC | PRN
  Administered 2020-05-29: 2 mg via INTRAVENOUS

## 2020-05-29 MED ORDER — HEPARIN SODIUM (PORCINE) 1000 UNIT/ML IJ SOLN
INTRAMUSCULAR | Status: AC
  Filled 2020-05-29: qty 1

## 2020-05-29 MED ORDER — HEPARIN (PORCINE) IN NACL 1000-0.9 UT/500ML-% IV SOLN
INTRAVENOUS | Status: AC
  Filled 2020-05-29: qty 1000

## 2020-05-29 MED ORDER — HEPARIN SODIUM (PORCINE) 1000 UNIT/ML IJ SOLN
INTRAMUSCULAR | Status: DC | PRN
  Administered 2020-05-29: 5500 [IU] via INTRAVENOUS

## 2020-05-29 MED ORDER — FENTANYL CITRATE (PF) 100 MCG/2ML IJ SOLN
INTRAMUSCULAR | Status: AC
  Filled 2020-05-29: qty 2

## 2020-05-29 MED ORDER — MIDAZOLAM HCL 2 MG/2ML IJ SOLN
INTRAMUSCULAR | Status: AC
  Filled 2020-05-29: qty 2

## 2020-05-29 MED ORDER — SODIUM CHLORIDE 0.9 % WEIGHT BASED INFUSION
3.0000 mL/kg/h | INTRAVENOUS | Status: AC
  Administered 2020-05-29: 07:00:00 3 mL/kg/h via INTRAVENOUS

## 2020-05-29 MED ORDER — SODIUM CHLORIDE 0.9 % IV SOLN: INTRAVENOUS | Status: AC

## 2020-05-29 MED ORDER — ACETAMINOPHEN 325 MG PO TABS: 650.0000 mg | ORAL_TABLET | ORAL | Status: DC | PRN

## 2020-05-29 MED ORDER — METFORMIN HCL 850 MG PO TABS: 850.0000 mg | ORAL_TABLET | Freq: Two times a day (BID) | ORAL | Status: AC

## 2020-05-29 MED ORDER — FENTANYL CITRATE (PF) 100 MCG/2ML IJ SOLN
INTRAMUSCULAR | Status: DC | PRN
  Administered 2020-05-29: 25 ug via INTRAVENOUS

## 2020-05-29 MED ORDER — ASPIRIN 81 MG PO CHEW
81.0000 mg | CHEWABLE_TABLET | ORAL | Status: AC
  Administered 2020-05-29: 07:00:00 81 mg via ORAL
  Filled 2020-05-29: qty 1

## 2020-05-29 MED ORDER — HYDRALAZINE HCL 20 MG/ML IJ SOLN
INTRAMUSCULAR | Status: DC | PRN
  Administered 2020-05-29 (×2): 10 mg via INTRAVENOUS

## 2020-05-29 MED ORDER — ONDANSETRON HCL 4 MG/2ML IJ SOLN: 4.0000 mg | Freq: Four times a day (QID) | INTRAMUSCULAR | Status: DC | PRN

## 2020-05-29 MED ORDER — LIDOCAINE HCL (PF) 1 % IJ SOLN
INTRAMUSCULAR | Status: DC | PRN
  Administered 2020-05-29 (×2): 2 mL

## 2020-05-29 SURGICAL SUPPLY — 11 items

## 2020-05-29 NOTE — Interval H&P Note (Signed)
Cath Lab Visit (complete for each Cath Lab visit)  Clinical Evaluation Leading to the Procedure:   ACS: No.  Non-ACS:    Anginal Classification: CCS III  Anti-ischemic medical therapy: Minimal Therapy (1 class of medications)  Non-Invasive Test Results: No non-invasive testing performed  Prior CABG: No previous CABG      History and Physical Interval Note:  05/29/2020 8:55 AM  Frederick Gomez  has presented today for surgery, with the diagnosis of sob.  The various methods of treatment have been discussed with the patient and family. After consideration of risks, benefits and other options for treatment, the patient has consented to  Procedure(s): RIGHT/LEFT HEART CATH AND CORONARY ANGIOGRAPHY (N/A) as a surgical intervention.  The patient's history has been reviewed, patient examined, no change in status, stable for surgery.  I have reviewed the patient's chart and labs.  Questions were answered to the patient's satisfaction.     Lance Muss

## 2020-05-29 NOTE — Research (Signed)
PHDEV Informed Consent   Subject Name: Frederick Gomez  Subject met inclusion and exclusion criteria.  The informed consent form, study requirements and expectations were reviewed with the subject and questions and concerns were addressed prior to the signing of the consent form.  The subject verbalized understanding of the trail requirements.  The subject agreed to participate in the [facility] trial and signed the informed consent.  The informed consent was obtained prior to performance of any protocol-specific procedures for the subject.  A copy of the signed informed consent was given to the subject and a copy was placed in the subject's medical record.  [name] D 05/29/2020, 0745 am

## 2020-05-29 NOTE — Discharge Instructions (Signed)
Drink plenty of fluids for 48 hours and keep wrist elevated at heart level for 24 hours  Radial Site Care   This sheet gives you information about how to care for yourself after your procedure. Your health care provider may also give you more specific instructions. If you have problems or questions, contact your health care provider. What can I expect after the procedure? After the procedure, it is common to have:  Bruising and tenderness at the catheter insertion area. Follow these instructions at home: Medicines  Take over-the-counter and prescription medicines only as told by your health care provider. Insertion site care 1. Follow instructions from your health care provider about how to take care of your insertion site. Make sure you: ? Wash your hands with soap and water before you change your bandage (dressing). If soap and water are not available, use hand sanitizer. ? Remove your dressing as told by your health care provider. In 24 hours 2. Check your insertion site every day for signs of infection. Check for: ? Redness, swelling, or pain. ? Fluid or blood. ? Pus or a bad smell. ? Warmth. 3. Do not take baths, swim, or use a hot tub until your health care provider approves. 4. You may shower 24-48 hours after the procedure, or as directed by your health care provider. ? Remove the dressing and gently wash the site with plain soap and water. ? Pat the area dry with a clean towel. ? Do not rub the site. That could cause bleeding. 5. Do not apply powder or lotion to the site. Activity   1. For 24 hours after the procedure, or as directed by your health care provider: ? Do not flex or bend the affected arm. ? Do not push or pull heavy objects with the affected arm. ? Do not drive yourself home from the hospital or clinic. You may drive 24 hours after the procedure unless your health care provider tells you not to. ? Do not operate machinery or power tools. 2. Do not lift  anything that is heavier than 10 lb (4.5 kg), or the limit that you are told, until your health care provider says that it is safe.  For 4 days 3. Ask your health care provider when it is okay to: ? Return to work or school. ? Resume usual physical activities or sports. ? Resume sexual activity. General instructions  If the catheter site starts to bleed, raise your arm and put firm pressure on the site. If the bleeding does not stop, get help right away. This is a medical emergency.  If you went home on the same day as your procedure, a responsible adult should be with you for the first 24 hours after you arrive home.  Keep all follow-up visits as told by your health care provider. This is important. Contact a health care provider if:  You have a fever.  You have redness, swelling, or yellow drainage around your insertion site. Get help right away if:  You have unusual pain at the radial site.  The catheter insertion area swells very fast.  The insertion area is bleeding, and the bleeding does not stop when you hold steady pressure on the area.  Your arm or hand becomes pale, cool, tingly, or numb. These symptoms may represent a serious problem that is an emergency. Do not wait to see if the symptoms will go away. Get medical help right away. Call your local emergency services (911 in the [place] Do   not drive yourself to the hospital. Summary  After the procedure, it is common to have bruising and tenderness at the site.  Follow instructions from your health care provider about how to take care of your radial site wound. Check the wound every day for signs of infection.  Do not lift anything that is heavier than 10 lb (4.5 kg), or the limit that you are told, until your health care provider says that it is safe. This information is not intended to replace advice given to you by your health care provider. Make sure you discuss any questions you have with your health care  provider. Document Revised: 07/01/2017 Document Reviewed: 07/01/2017 Elsevier Patient Education  2020 [employer]  

## 2020-05-30 MED FILL — Hydralazine HCl Inj 20 MG/ML: INTRAMUSCULAR | Qty: 1 | Status: AC

## 2020-05-30 MED FILL — Lidocaine HCl Local Preservative Free (PF) Inj 1%: INTRAMUSCULAR | Qty: 30 | Status: AC

## 2020-05-30 MED FILL — Metoprolol Tartrate IV Soln 5 MG/5ML: INTRAVENOUS | Qty: 5 | Status: AC

## 2020-06-14 ENCOUNTER — Encounter: Payer: Self-pay | Admitting: Cardiology

## 2020-06-14 ENCOUNTER — Other Ambulatory Visit: Payer: Self-pay

## 2020-06-14 ENCOUNTER — Ambulatory Visit: Payer: BC Managed Care – PPO | Admitting: Cardiology

## 2020-06-14 VITALS — BP 160/90 | HR 78 | Ht 68.0 in | Wt 252.8 lb

## 2020-06-14 DIAGNOSIS — E119 Type 2 diabetes mellitus without complications: Secondary | ICD-10-CM

## 2020-06-14 DIAGNOSIS — E782 Mixed hyperlipidemia: Secondary | ICD-10-CM

## 2020-06-14 DIAGNOSIS — I10 Essential (primary) hypertension: Secondary | ICD-10-CM

## 2020-06-14 DIAGNOSIS — G4733 Obstructive sleep apnea (adult) (pediatric): Secondary | ICD-10-CM

## 2020-06-14 DIAGNOSIS — E669 Obesity, unspecified: Secondary | ICD-10-CM

## 2020-06-14 DIAGNOSIS — R0602 Shortness of breath: Secondary | ICD-10-CM

## 2020-06-14 DIAGNOSIS — G459 Transient cerebral ischemic attack, unspecified: Secondary | ICD-10-CM

## 2020-06-14 DIAGNOSIS — I350 Nonrheumatic aortic (valve) stenosis: Secondary | ICD-10-CM

## 2020-06-14 MED ORDER — CARVEDILOL 25 MG PO TABS: 25.0000 mg | ORAL_TABLET | Freq: Two times a day (BID) | ORAL | 3 refills | Status: AC

## 2020-06-14 NOTE — Patient Instructions (Addendum)
Medication Instructions:  Increase Carvedilol (Coreg) to 25 mg twice a day   *If you need a refill on your cardiac medications before your next appointment, please call your pharmacy*   Lab Work: None ordered   If you have labs (blood work) drawn today and your tests are completely normal, you will receive your results only by: Marland Kitchen MyChart Message (if you have MyChart) OR . A paper copy in the mail If you have any lab test that is abnormal or we need to change your treatment, we will call you to review the results.   Testing/Procedures: Your physician has recommended that you have a sleep study. This test records several body functions during sleep, including: brain activity, eye movement, oxygen and carbon dioxide blood levels, heart rate and rhythm, breathing rate and rhythm, the flow of air through your mouth and nose, snoring, body muscle movements, and chest and belly movement.   Your physician has recommended that you have a cardiopulmonary stress test (CPX). CPX testing is a non-invasive measurement of heart and lung function. It replaces a traditional treadmill stress test. This type of test provides a tremendous amount of information that relates not only to your present condition but also for future outcomes. This test combines measurements of you ventilation, respiratory gas exchange in the lungs, electrocardiogram (EKG), blood pressure and physical response before, during, and following an exercise protocol.   Follow-Up: At [facility], you and your health needs are our priority.  As part of our continuing mission to provide you with exceptional heart care, we have created designated Provider Care Teams.  These Care Teams include your primary Cardiologist (physician) and Advanced Practice Providers (APPs -  Physician Assistants and Nurse Practitioners) who all work together to provide you with the care you need, when you need it.  We recommend signing up for the patient portal  called "MyChart".  Sign up information is provided on this After Visit Summary.  MyChart is used to connect with patients for Virtual Visits (Telemedicine).  Patients are able to view lab/test results, encounter notes, upcoming appointments, etc.  Non-urgent messages can be sent to your provider as well.   To learn more about what you can do with MyChart, go to [email or web address].    Your next appointment:   2 month(s)  The format for your next appointment:   In Person  Provider:   Thomasene Ripple, DO   Other instructions

## 2020-06-14 NOTE — Progress Notes (Signed)
Cardiology Office Note:    Date:  06/14/2020   ID:  Frederick Gomez, DOB [birth year], MRN [id number]  PCP:  Ninfa Meeker, FNP  Cardiologist:  Thomasene Ripple, DO  Electrophysiologist:  None   Referring MD: Vertell Novak*   I am still short of breath  History of Present Illness:    Frederick Gomez is a 60 y.o. male with a hx of  hypertension, diabetes mellitus, hyperlipidemia, mild aortic stenosis, history of COVID-19 infection, history of TIA, obesity, OSA on CPAP at home presents today for follow-up visit.  I last the patient on September 26, 2019 at that time he reported that he had some shortness of breath.  At the conclusion of visit I did talk with the patient about taking his albuterol as prescribed and also increase his Lasix to 60 mg in the morning 40 in the afternoon.  I recommended the patient get an echocardiogram at that time to reassess his valvular abnormalities. Unfortunately he did not get his testing done.   I saw the patient on May 21, 2020 at that time he experiencing significant worsening shortness of breath with suspected to be angina I sent the patient for right and left heart catheterization to understand and also look at his right heart pressures to detect for elevated right-sided pressures.   He had his left heart catheterization which showed mild diffuse CAD however evidence of mild pulmonary arterial hypertension.  He tells me today he still does have some shortness of breath on exertion.  Past Medical History:  Diagnosis Date  . Ascending aortic aneurysm (HCC) 03/30/2019  . COVID-19 virus infection 07/12/2019  . Diabetes (HCC)   . Essential hypertension 03/30/2019  . Hyperlipidemia   . Hypertension   . Mild aortic regurgitation 03/30/2019  . Mild aortic stenosis 03/30/2019  . Mixed hyperlipidemia 03/30/2019  . Obese   . Obesity (BMI 30-39.9) 03/30/2019  . OSA (obstructive sleep apnea) 04/01/2016  . OSA on CPAP   . Seasonal allergies   . TIA  (transient ischemic attack)   . Type 2 diabetes mellitus without complication, without long-term current use of insulin (HCC) 03/30/2019    Past Surgical History:  Procedure Laterality Date  . CARDIAC CATHETERIZATION    . HEMORROIDECTOMY    . NOSE SURGERY    . RIGHT/LEFT HEART CATH AND CORONARY ANGIOGRAPHY N/A 05/29/2020   Procedure: RIGHT/LEFT HEART CATH AND CORONARY ANGIOGRAPHY;  Surgeon: Corky Crafts, MD;  Location: [facility] INVASIVE CV LAB;  Service: Cardiovascular;  Laterality: N/A;    Current Medications: Current Meds  Medication Sig  . albuterol (VENTOLIN HFA) 108 (90 Base) MCG/ACT inhaler Inhale 2 puffs into the lungs every 6 (six) hours as needed for wheezing or shortness of breath.  . carvedilol (COREG) 25 MG tablet Take 1 tablet (25 mg total) by mouth 2 (two) times daily.  . clopidogrel (PLAVIX) 75 MG tablet Take 75 mg by mouth daily.   Marland Kitchen doxycycline (VIBRA-TABS) 100 MG tablet Take 100 mg by mouth 2 (two) times daily.  . furosemide (LASIX) 40 MG tablet Take 2.5 tablets (100 mg total) by mouth daily. Take 60 mg in the morning and 40 mg in the evening (Patient taking differently: Take 40 mg by mouth daily.)  . lovastatin (MEVACOR) 20 MG tablet Take 20 mg by mouth at bedtime.   . metFORMIN (GLUCOPHAGE) 850 MG tablet Take 1 tablet (850 mg total) by mouth 2 (two) times daily.  . polycarbophil (FIBERCON) 625 MG tablet Take 625 mg  by mouth daily as needed (constipation.).   Marland Kitchen potassium chloride SA (KLOR-CON) 20 MEQ tablet Take 2 tablets (40 mEq total) by mouth daily.  . primidone (MYSOLINE) 250 MG tablet Take 250 mg by mouth daily as needed (tremor).   . Vitamin D, Ergocalciferol, (DRISDOL) 1.25 MG (50000 UNIT) CAPS capsule Take 1 capsule (50,000 Units total) by mouth every 7 (seven) days.  . [DISCONTINUED] carvedilol (COREG) 12.5 MG tablet Take 1 tablet (12.5 mg total) by mouth 2 (two) times daily. (Patient taking differently: Take 12.5 mg by mouth daily.)     Allergies:    Hydrochlorothiazide, Niaspan [niacin], Pravastatin, Simvastatin, and Latex   Social History   Socioeconomic History  . Marital status: Single    Spouse name: Not on file  . Number of children: Not on file  . Years of education: Not on file  . Highest education level: Not on file  Occupational History  . Not on file  Tobacco Use  . Smoking status: Former Smoker    Years: 3.00    Types: Cigarettes    Quit date: 06/10/1979    Years since quitting: 41.0  . Smokeless tobacco: Never Used  Vaping Use  . Vaping Use: Never used  Substance and Sexual Activity  . Alcohol use: Never  . Drug use: Never  . Sexual activity: Not Currently  Other Topics Concern  . Not on file  Social History Narrative  . Not on file   Social Determinants of Health   Financial Resource Strain: Not on file  Food Insecurity: Not on file  Transportation Needs: Not on file  Physical Activity: Not on file  Stress: Not on file  Social Connections: Not on file     Family History: The patient's family history includes Diabetes in his mother; Heart attack in his mother; Suicidality in his father.  ROS:   Review of Systems  Constitution: Negative for decreased appetite, fever and weight gain.  HENT: Negative for congestion, ear discharge, hoarse voice and sore throat.   Eyes: Negative for discharge, redness, vision loss in right eye and visual halos.  Cardiovascular: Negative for chest pain, dyspnea on exertion, leg swelling, orthopnea and palpitations.  Respiratory: Negative for cough, hemoptysis, shortness of breath and snoring.   Endocrine: Negative for heat intolerance and polyphagia.  Hematologic/Lymphatic: Negative for bleeding problem. Does not bruise/bleed easily.  Skin: Negative for flushing, nail changes, rash and suspicious lesions.  Musculoskeletal: Negative for arthritis, joint pain, muscle cramps, myalgias, neck pain and stiffness.  Gastrointestinal: Negative for abdominal pain, bowel  incontinence, diarrhea and excessive appetite.  Genitourinary: Negative for decreased libido, genital sores and incomplete emptying.  Neurological: Negative for brief paralysis, focal weakness, headaches and loss of balance.  Psychiatric/Behavioral: Negative for altered mental status, depression and suicidal ideas.  Allergic/Immunologic: Negative for HIV exposure and persistent infections.    EKGs/Labs/Other Studies Reviewed:    The following studies were reviewed today:   EKG: None today  Right left heart catheterization   LV end diastolic pressure is moderately elevated.  There is mild aortic valve stenosis.  Hemodynamic findings consistent with mild pulmonary hypertension.  Ao sat 99%, PA sat 71%, PA pressure 44/25, mean PA 33 mm Hg; mean PCWP 22 mm HG; CO 4.8 L/min; CI 2.1  Mild, diffuse, nonobstructive CAD.   Medical therapy for hypertension and other risk factor modification.  Workup for pulmonary hypertension as well.    Recent Labs: 07/16/2019: ALT 34; B Natriuretic Peptide 129.3 09/26/2019: Magnesium 2.1; NT-Pro BNP  135 05/21/2020: BUN 15; Creatinine, Ser 0.96; Platelets 223 05/29/2020: Hemoglobin 15.6; Potassium 4.2; Sodium 142  Recent Lipid Panel    Component Value Date/Time   CHOL 175 03/29/2019 0858   TRIG 108 03/29/2019 0858   HDL 37 (L) 03/29/2019 0858   CHOLHDL 4.7 03/29/2019 0858   LDLCALC 118 (H) 03/29/2019 0858    Physical Exam:    VS:  BP (!) 160/90   Pulse 78   Ht 5\' 8"  (1.727 m)   Wt 252 lb 12.8 oz (114.7 kg)   SpO2 95%   BMI 38.44 kg/m     Wt Readings from Last 3 Encounters:  06/14/20 252 lb 12.8 oz (114.7 kg)  05/29/20 245 lb (111.1 kg)  05/21/20 247 lb 3.2 oz (112.1 kg)     GEN: Well nourished, well developed in no acute distress HEENT: Normal NECK: No JVD; No carotid bruits LYMPHATICS: No lymphadenopathy CARDIAC: S1S2 noted,RRR, no murmurs, rubs, gallops RESPIRATORY:  Clear to auscultation without rales, wheezing or rhonchi   ABDOMEN: Soft, non-tender, non-distended, +bowel sounds, no guarding. EXTREMITIES: No edema, No cyanosis, no clubbing MUSCULOSKELETAL:  No deformity  SKIN: Warm and dry NEUROLOGIC:  Alert and oriented x 3, non-focal PSYCHIATRIC:  Normal affect, good insight  ASSESSMENT:    1. SOB (shortness of breath)   2. OSA (obstructive sleep apnea)   3. Essential hypertension   4. Mild aortic stenosis   5. TIA (transient ischemic attack)   6. Type 2 diabetes mellitus without complication, without long-term current use of insulin (HCC)   7. Mixed hyperlipidemia   8. Obesity (BMI 30-39.9)    PLAN:     He still has shortness shortness of breath.  After reviewing his  mean pulmonary artery pressure 33 mmHg which reflect mild pulmonary hypertension.  I am suspecting that he has group 2 pulmonary hypertension given the fact that he does have untreated obstructive sleep apnea.  He has not been able to discuss a repeat sleep study with his PCP at this time we will going to recommend the patient for repeat sleep study and hopefully he will be able to get a CPAP that fits and titrated appropriately.  In the meantime a cardiac pulmonary exercise stress test will also be ordered.  I am holding off on a VQ scan for now.  Once the above testings has been done I plan to refer the patient to our advanced heart failure clinic.  Continue patient on his Plavix.  His blood pressure is elevated today in the office I am going to increase his Coreg to 25 mg twice a day.  With very limited as the patient has problem with affording his medications so I cannot really add additional medications.  My goal is this new regimen he cannot keep him less than 130/80 mmHg.   Diabetes mellitus per his PCP.  The patient understands the need to lose weight with diet and exercise. We have discussed specific strategies for this.  The patient is in agreement with the above plan. The patient left the office in stable condition.  The  patient will follow up in 2 months or sooner if needed.   Medication Adjustments/Labs and Tests Ordered: Current medicines are reviewed at length with the patient today.  Concerns regarding medicines are outlined above.  Orders Placed This Encounter  Procedures  . Cardiopulmonary exercise test  . Split night study   Meds ordered this encounter  Medications  . carvedilol (COREG) 25 MG tablet    Sig:  Take 1 tablet (25 mg total) by mouth 2 (two) times daily.    Dispense:  180 tablet    Refill:  3    Patient Instructions  Medication Instructions:  Increase Carvedilol (Coreg) to 25 mg twice a day   *If you need a refill on your cardiac medications before your next appointment, please call your pharmacy*   Lab Work: None ordered   If you have labs (blood work) drawn today and your tests are completely normal, you will receive your results only by: Marland Kitchen MyChart Message (if you have MyChart) OR . A paper copy in the mail If you have any lab test that is abnormal or we need to change your treatment, we will call you to review the results.   Testing/Procedures: Your physician has recommended that you have a sleep study. This test records several body functions during sleep, including: brain activity, eye movement, oxygen and carbon dioxide blood levels, heart rate and rhythm, breathing rate and rhythm, the flow of air through your mouth and nose, snoring, body muscle movements, and chest and belly movement.  You physician has ordered for you to have a cardiopulmonary excercise test     Follow-Up: At [facility], you and your health needs are our priority.  As part of our continuing mission to provide you with exceptional heart care, we have created designated Provider Care Teams.  These Care Teams include your primary Cardiologist (physician) and Advanced Practice Providers (APPs -  Physician Assistants and Nurse Practitioners) who all work together to provide you with the care you  need, when you need it.  We recommend signing up for the patient portal called "MyChart".  Sign up information is provided on this After Visit Summary.  MyChart is used to connect with patients for Virtual Visits (Telemedicine).  Patients are able to view lab/test results, encounter notes, upcoming appointments, etc.  Non-urgent messages can be sent to your provider as well.   To learn more about what you can do with MyChart, go to [email or web address].    Your next appointment:   2 month(s)  The format for your next appointment:   In Person  Provider:   Thomasene Ripple, DO   Other Instructions None      Adopting a Healthy Lifestyle.  Know what a healthy weight is for you (roughly BMI <25) and aim to maintain this   Aim for 7+ servings of fruits and vegetables daily   65-80+ fluid ounces of water or unsweet tea for healthy kidneys   Limit to max 1 drink of alcohol per day; avoid smoking/tobacco   Limit animal fats in diet for cholesterol and heart health - choose grass fed whenever available   Avoid highly processed foods, and foods high in saturated/trans fats   Aim for low stress - take time to unwind and care for your mental health   Aim for 150 min of moderate intensity exercise weekly for heart health, and weights twice weekly for bone health   Aim for 7-9 hours of sleep daily   When it comes to diets, agreement about the perfect plan isnt easy to find, even among the experts. Experts at the [facility] of [employer] developed an idea known as the Healthy Eating Plate. Just imagine a plate divided into logical, healthy portions.   The emphasis is on diet quality:   Load up on vegetables and fruits - one-half of your plate: Aim for color and variety, and remember that potatoes dont count.  Go for whole grains - one-quarter of your plate: Whole wheat, barley, wheat berries, quinoa, oats, brown rice, and foods made with them. If you want pasta, go with whole  wheat pasta.   Protein power - one-quarter of your plate: Fish, chicken, beans, and nuts are all healthy, versatile protein sources. Limit red meat.   The diet, however, does go beyond the plate, offering a few other suggestions.   Use healthy plant oils, such as olive, canola, soy, corn, sunflower and peanut. Check the labels, and avoid partially hydrogenated oil, which have unhealthy trans fats.   If youre thirsty, drink water. Coffee and tea are good in moderation, but skip sugary drinks and limit milk and dairy products to one or two daily servings.   The type of carbohydrate in the diet is more important than the amount. Some sources of carbohydrates, such as vegetables, fruits, whole grains, and beans-are healthier than others.   Finally, stay active  Signed, Thomasene Ripple, DO  06/14/2020 8:40 AM    [facility]

## 2020-07-12 ENCOUNTER — Encounter: Payer: Self-pay | Admitting: Pulmonary Disease

## 2020-07-12 ENCOUNTER — Ambulatory Visit (INDEPENDENT_AMBULATORY_CARE_PROVIDER_SITE_OTHER): Payer: BC Managed Care – PPO | Admitting: Pulmonary Disease

## 2020-07-12 ENCOUNTER — Other Ambulatory Visit: Payer: Self-pay

## 2020-07-12 VITALS — BP 144/78 | HR 78 | Temp 97.1°F | Ht 68.5 in | Wt 248.2 lb

## 2020-07-12 DIAGNOSIS — G4733 Obstructive sleep apnea (adult) (pediatric): Secondary | ICD-10-CM

## 2020-07-12 NOTE — Patient Instructions (Signed)
History of obstructive sleep apnea -Not been able to tolerate CPAP well recently  Study was many years ago  .  Schedule you for a split-night study .  We'll contact [facility] to see whether we can get any information regarding your CPAP use on pressure  .  Weight loss efforts  .  Call with significant concerns  .  I will see you back in the office following the sleep study   Sleep Apnea Sleep apnea affects breathing during sleep. It causes breathing to stop for a short time or to become shallow. It can also increase the risk of:  Heart attack.  Stroke.  Being very overweight (obese).  Diabetes.  Heart failure.  Irregular heartbeat. The goal of treatment is to help you breathe normally again. What are the causes? There are three kinds of sleep apnea:  Obstructive sleep apnea. This is caused by a blocked or collapsed airway.  Central sleep apnea. This happens when the brain does not send the right signals to the muscles that control breathing.  Mixed sleep apnea. This is a combination of obstructive and central sleep apnea. The most common cause of this condition is a collapsed or blocked airway. This can happen if:  Your throat muscles are too relaxed.  Your tongue and tonsils are too large.  You are overweight.  Your airway is too small.   What increases the risk?  Being overweight.  Smoking.  Having a small airway.  Being older.  Being male.  Drinking alcohol.  Taking medicines to calm yourself (sedatives or tranquilizers).  Having family members with the condition. What are the signs or symptoms?  Trouble staying asleep.  Being sleepy or tired during the day.  Getting angry a lot.  Loud snoring.  Headaches in the morning.  Not being able to focus your mind (concentrate).  Forgetting things.  Less interest in sex.  Mood swings.  Personality changes.  Feelings of sadness (depression).  Waking up a lot during the night to pee  (urinate).  Dry mouth.  Sore throat. How is this diagnosed?  Your medical history.  A physical exam.  A test that is done when you are sleeping (sleep study). The test is most often done in a sleep lab but may also be done at home. How is this treated?  Sleeping on your side.  Using a medicine to get rid of mucus in your nose (decongestant).  Avoiding the use of alcohol, medicines to help you relax, or certain pain medicines (narcotics).  Losing weight, if needed.  Changing your diet.  Not smoking.  Using a machine to open your airway while you sleep, such as: ? An oral appliance. This is a mouthpiece that shifts your lower jaw forward. ? A CPAP device. This device blows air through a mask when you breathe out (exhale). ? An EPAP device. This has valves that you put in each nostril. ? A BPAP device. This device blows air through a mask when you breathe in (inhale) and breathe out.  Having surgery if other treatments do not work. It is important to get treatment for sleep apnea. Without treatment, it can lead to:  High blood pressure.  Coronary artery disease.  In men, not being able to have an erection (impotence).  Reduced thinking ability.   Follow these instructions at home: Lifestyle  Make changes that your doctor recommends.  Eat a healthy diet.  Lose weight if needed.  Avoid alcohol, medicines to help you relax, and  some pain medicines.  Do not use any products that contain nicotine or tobacco, such as cigarettes, e-cigarettes, and chewing tobacco. If you need help quitting, ask your doctor. General instructions  Take over-the-counter and prescription medicines only as told by your doctor.  If you were given a machine to use while you sleep, use it only as told by your doctor.  If you are having surgery, make sure to tell your doctor you have sleep apnea. You may need to bring your device with you.  Keep all follow-up visits as told by your doctor.  This is important. Contact a doctor if:  The machine that you were given to use during sleep bothers you or does not seem to be working.  You do not get better.  You get worse. Get help right away if:  Your chest hurts.  You have trouble breathing in enough air.  You have an uncomfortable feeling in your back, arms, or stomach.  You have trouble talking.  One side of your body feels weak.  A part of your face is hanging down. These symptoms may be an emergency. Do not wait to see if the symptoms will go away. Get medical help right away. Call your local emergency services (911 in the [place] Do not drive yourself to the hospital. Summary  This condition affects breathing during sleep.  The most common cause is a collapsed or blocked airway.  The goal of treatment is to help you breathe normally while you sleep. This information is not intended to replace advice given to you by your health care provider. Make sure you discuss any questions you have with your health care provider. Document Revised: 03/12/2018 Document Reviewed: 01/19/2018 Elsevier Patient Education  2021 [employer].

## 2020-07-12 NOTE — Progress Notes (Signed)
Frederick Gomez    [id number]    [birth year]  Primary Care Physician:Mitchell, Rulon Sera, FNP  Referring Physician: Thomasene Ripple, DO [address] [place],  [place] [address]  Chief complaint:   Patient being seen for obstructive sleep apnea  HPI:  Diagnosed with obstructive sleep apnea many years ago Has not been using CPAP on a regular basis  He is tired all the time Denies headaches Occasional dryness of his mouth in the mornings Currently uses a CPAP about 2-3 nights a week Able to fall asleep easily Wakes up multiple times  Does not smoke, does not use alcohol  No obstructive sleep apnea in the family  Usually goes to bed about 11 PM Falls asleep easily Wakes up about 3 times  Final wake up time about 8 AM  Weight has been stable  His initial sleep study was many years ago as far back as 1993   Outpatient Encounter Medications as of 07/12/2020  Medication Sig  . albuterol (VENTOLIN HFA) 108 (90 Base) MCG/ACT inhaler Inhale 2 puffs into the lungs every 6 (six) hours as needed for wheezing or shortness of breath.  . carvedilol (COREG) 25 MG tablet Take 1 tablet (25 mg total) by mouth 2 (two) times daily.  . clopidogrel (PLAVIX) 75 MG tablet Take 75 mg by mouth daily.   . furosemide (LASIX) 40 MG tablet Take 2.5 tablets (100 mg total) by mouth daily. Take 60 mg in the morning and 40 mg in the evening (Patient taking differently: Take 40 mg by mouth daily.)  . lovastatin (MEVACOR) 20 MG tablet Take 20 mg by mouth at bedtime.   . metFORMIN (GLUCOPHAGE) 850 MG tablet Take 1 tablet (850 mg total) by mouth 2 (two) times daily.  . polycarbophil (FIBERCON) 625 MG tablet Take 625 mg by mouth daily as needed (constipation.).   Marland Kitchen potassium chloride SA (KLOR-CON) 20 MEQ tablet Take 2 tablets (40 mEq total) by mouth daily.  . primidone (MYSOLINE) 250 MG tablet Take 250 mg by mouth daily as needed (tremor).   . Vitamin D, Ergocalciferol, (DRISDOL) 1.25 MG (50000 UNIT) CAPS  capsule Take 1 capsule (50,000 Units total) by mouth every 7 (seven) days.  Marland Kitchen doxycycline (VIBRA-TABS) 100 MG tablet Take 100 mg by mouth 2 (two) times daily. (Patient not taking: Reported on 07/12/2020)   No facility-administered encounter medications on file as of 07/12/2020.    Allergies as of 07/12/2020 - Review Complete 07/12/2020  Allergen Reaction Noted  . Hydrochlorothiazide Other (See Comments) 02/23/2019  . Niaspan [niacin] Other (See Comments) 02/23/2019  . Pravastatin Nausea Only 02/23/2019  . Simvastatin Other (See Comments) 02/23/2019  . Latex Rash 12/08/2013    Past Medical History:  Diagnosis Date  . Ascending aortic aneurysm (HCC) 03/30/2019  . COVID-19 virus infection 07/12/2019  . Diabetes (HCC)   . Essential hypertension 03/30/2019  . Hyperlipidemia   . Hypertension   . Mild aortic regurgitation 03/30/2019  . Mild aortic stenosis 03/30/2019  . Mixed hyperlipidemia 03/30/2019  . Obese   . Obesity (BMI 30-39.9) 03/30/2019  . OSA (obstructive sleep apnea) 04/01/2016  . OSA on CPAP   . Seasonal allergies   . TIA (transient ischemic attack)   . Type 2 diabetes mellitus without complication, without long-term current use of insulin (HCC) 03/30/2019    Past Surgical History:  Procedure Laterality Date  . CARDIAC CATHETERIZATION    . HEMORROIDECTOMY    . NOSE SURGERY    .  RIGHT/LEFT HEART CATH AND CORONARY ANGIOGRAPHY N/A 05/29/2020   Procedure: RIGHT/LEFT HEART CATH AND CORONARY ANGIOGRAPHY;  Surgeon: Corky Crafts, MD;  Location: [facility] INVASIVE CV LAB;  Service: Cardiovascular;  Laterality: N/A;    Family History  Problem Relation Age of Onset  . Diabetes Mother   . Heart attack Mother   . Suicidality Father     Social History   Socioeconomic History  . Marital status: Single    Spouse name: Not on file  . Number of children: Not on file  . Years of education: Not on file  . Highest education level: Not on file  Occupational History  . Not on  file  Tobacco Use  . Smoking status: Former Smoker    Years: 3.00    Types: Cigarettes    Quit date: 06/10/1979    Years since quitting: 41.1  . Smokeless tobacco: Never Used  Vaping Use  . Vaping Use: Never used  Substance and Sexual Activity  . Alcohol use: Never  . Drug use: Never  . Sexual activity: Not Currently  Other Topics Concern  . Not on file  Social History Narrative  . Not on file   Social Determinants of Health   Financial Resource Strain: Not on file  Food Insecurity: Not on file  Transportation Needs: Not on file  Physical Activity: Not on file  Stress: Not on file  Social Connections: Not on file  Intimate Partner Violence: Not on file    Review of Systems  Constitutional: Positive for fatigue.  Respiratory: Positive for apnea.   Psychiatric/Behavioral: Positive for sleep disturbance.    Vitals:   07/12/20 1002  BP: (!) 144/78  Pulse: 78  Temp: (!) 97.1 F (36.2 C)  SpO2: 96%     Physical Exam Constitutional:      Appearance: He is obese.  HENT:     Head: Normocephalic and atraumatic.     Mouth/Throat:     Mouth: Mucous membranes are moist.  Cardiovascular:     Rate and Rhythm: Normal rate and regular rhythm.     Heart sounds: No murmur heard. No friction rub.  Pulmonary:     Effort: No respiratory distress.     Breath sounds: No stridor. No wheezing or rhonchi.  Musculoskeletal:     Cervical back: No rigidity or tenderness.  Neurological:     Mental Status: He is alert.  Psychiatric:        Mood and Affect: Mood normal.    Results of the Epworth flowsheet 07/12/2020 04/01/2016  Sitting and reading 3 1  Watching TV 1 1  Sitting, inactive in a public place (e.g. a theatre or a meeting) 0 0  As a passenger in a car for an hour without a break 0 0  Lying down to rest in the afternoon when circumstances permit 3 0  Sitting and talking to someone 0 0  Sitting quietly after a lunch without alcohol 0 0  In a car, while stopped for a  few minutes in traffic 0 0  Total score 7 2    Data Reviewed: Compliance data not available  Assessment:  History of obstructive sleep apnea  Excessive daytime sleepiness  Noncompliance with CPAP  Coronary artery disease Diabetes Hypertension  Pathophysiology of sleep disordered breathing discussed with the patient Treatment options discussed with the patient  Importance of treating sleep disordered breathing reiterated  Plan/Recommendations: Schedule patient for split-night study  We will contact [employer]-to see if we can get  a download from his machine  Encourage weight loss measures  I will see him back in about 3 to 4 months  Risks with not treating sleep disordered breathing discussed Virl Diamond MD  Pulmonary and Critical Care 07/12/2020, 10:12 AM  CC: Thomasene Ripple, DO

## 2020-08-13 ENCOUNTER — Ambulatory Visit: Payer: Self-pay | Admitting: Cardiology

## 2020-08-27 ENCOUNTER — Other Ambulatory Visit: Payer: Self-pay

## 2020-08-27 ENCOUNTER — Ambulatory Visit (HOSPITAL_BASED_OUTPATIENT_CLINIC_OR_DEPARTMENT_OTHER): Payer: BC Managed Care – PPO | Attending: Pulmonary Disease | Admitting: Pulmonary Disease

## 2020-08-27 DIAGNOSIS — G4733 Obstructive sleep apnea (adult) (pediatric): Secondary | ICD-10-CM | POA: Insufficient documentation

## 2020-09-02 ENCOUNTER — Telehealth: Payer: Self-pay | Admitting: Pulmonary Disease

## 2020-09-02 NOTE — Telephone Encounter (Signed)
Call patient  Sleep study result  Date of study: 08/27/2020  Impression: Moderate obstructive sleep apnea  Recommendation: DME referral  Recommend CPAP therapy for moderate obstructive sleep apnea  Trial of CPAP therapy on 17 cm H2O with a Large size Fisher&Paykel Full Face Mask Simplus mask and heated humidification.  Encourage weight loss measures  Follow-up in the office 4 to 6 weeks following initiation of treatment

## 2020-09-02 NOTE — Procedures (Signed)
POLYSOMNOGRAPHY  Last, First: Frederick, Gomez MRN: [id number] Gender: Male Age (years): 46 Weight (lbs): 254 DOB: Mar 30, 1961 BMI: 39 Primary Care: No PCP Epworth Score: <br> Referring: Tomma Lightning MD Technician: Rosette Reveal Interpreting: Tomma Lightning MD Study Type: Split Night CPAP Ordered Study Type: Split Night CPAP Study date: 08/27/2020 Location: [facility] CLINICAL INFORMATION Frederick Gomez is a 60 year old Male and was referred to the sleep center for evaluation of G47.33 OSA: Adult and Pediatric (327.23). Indications include Diabetes, Hypertension, OSA.  MEDICATIONS Patient self administered medications include: N/A. Medications administered during study include No sleep medicine administered.  SLEEP STUDY TECHNIQUE The patient underwent an attended overnight level one polysomnography titration to assess the effects of CPAP therapy. The following variables were monitored: EEG (C4-A1, C3-A2, O1-A2, O2-A1), EOG, submental and leg EMG, ECG, oxyhemoglobin saturation by pulse oximetry, thoracic and abdominal respiratory effort belts, nasal/oral airflow by pressure sensor, body position sensor and snoring sensor. CPAP pressure was titrated to eliminate apneas, hypopneas and oxygen desaturation. Hypopneas were scored per AASM definition IB (4% desaturation)  The NPSG portion of the study ended at 12:53:49 AM . The CPAP titration was initiated at 12:55:01 AM AM with the CPAP portion of the study ending at 5:00:00 AM.  TECHNICIAN COMMENTS Comments added by Technician: None Comments added by Scorer: N/A SLEEP ARCHITECTURE The recording time for the entire night was 375 minutes. The diagnostic portion was initiated at 10:44:59 PM and terminated at 12:53:49 AM. The time in bed was 128.8 minutes. EEG confirmed total sleep time was 125.0 minutes yielding a sleep efficiency of 97.0%%. Sleep onset after lights out was 3.3 minutes with a REM latency of 28.0 minutes. The patient spent  0.8%% of the night in stage N1 sleep, 73.2%% in stage N2 sleep, 0.0%% in stage N3 and 26% in REM. The Arousal Index was 4.3/hour.  The titration portion was initiated at 12:55:01 AM and terminated at 5:00:00 AM. The time in bed was 245.0 minutes. EEG confirmed total sleep time was 237.8 minutes yielding a sleep efficiency of 97.0%%. Sleep onset after CPAP initiation was 3.2 minutes with a REM latency of 53.5 minutes. The patient spent 2.3%% of the night in stage N1 sleep, 74.6%% in stage N2 sleep, 0.0%% in stage N3 and 23.1% in REM. The Arousal Index was 1.3/hour. RESPIRATORY PARAMETERS During the diagnostic portion, there were a total of 51 respiratory disturbances recorded; 1 apneas ( 1 obstructive, 0 mixed, 0 central), 45 hypopneas and 5 RERAs. The apnea/hypopnea index 22.1 was events/hour and the RDI was 24.5 events/hour. The central sleep apnea index was 0.0 events/hour. The REM AHI was 44.3 /h and NREM AHI was 14.3/h. The REM RDI was 46.2 /h and NREM RDI was 16.9 /h. The supine AHI was N/A/h, and the non supine AHI was 22.1/h; supine during 0.0%% of sleep. The supine RDI was 0.0/h, and the non supine RDI was 24.48/h. Respiratory disturbances were associated with oxygen desaturation down to a nadir of 84.0 % during sleep. The mean oxygen saturation during the study was 91.1%. The cumulative time under 88% oxygen saturation was 15.2 minutes.  During the titration portion, the apnea/hypopnea index (AHI) was 0.8 events/hour and the RDI was 0.8 events/hour. The central sleep apnea index was events/hour. The most appropriate setting of CPAP was IPAP/EPAP 17/17 cm H2O. At this setting, the sleep efficiency was 96% and the patient was supine for 100%. The AHI was 0 events per hour(with 0 central events). Oxygen nadir was 90.0. LEG  MOVEMENT DATA The periodic limb movement index was 0.0/hour with an associated arousal index of /hour. CARDIAC DATA The underlying cardiac rhythm was most consistent with sinus  rhythm. Mean heart rate was 66.6 during diagnostic portion and 62.8 during titration portion of study. Additional rhythm abnormalities include None.  IMPRESSIONS - Moderate Obstructive Sleep apnea(OSA) Optimal pressure attained. - EKG showed no cardiac abnormalities. - Moderate Oxygen Desaturation - The patient snored with loud snoring volume. - EEG did not show alpha intrusion. - No significant periodic leg movements(PLMs) during sleep, no significant associated arousals. - Normal sleep efficiency, short primary sleep latency, short REM sleep latency and no slow wave latency.  DIAGNOSIS - Obstructive Sleep Apnea (G47.33)  RECOMMENDATIONS - Trial of CPAP therapy on 17 cm H2O with a Large size Fisher&Paykel Full Face Mask Simplus mask and heated humidification. - Avoid alcohol, sedatives and other CNS depressants that may worsen sleep apnea and disrupt normal sleep architecture. - Sleep hygiene should be reviewed to assess factors that may improve sleep quality. - Weight management and regular exercise should be initiated or continued. - Return to Sleep Center for re-evaluation after 4 weeks of therapy  [Electronically signed] 09/02/2020 10:15 AM  Virl Diamond MD NPI: [id number]

## 2020-09-03 NOTE — Telephone Encounter (Signed)
ATC patient to go over sleep study results, per DPR left detailed message with results and asked patient to call office back to discuss further and orders can be placed at that time. Will wait to hear back from patient

## 2020-09-06 NOTE — Telephone Encounter (Signed)
Called and spoke with patient about sleep study results. He states that he has an appointment coming up with Dr. Jeanie Sewer and he thinks Dr. Jeanie Sewer is going to order the CPAP machine for him. Advised patient to call us back after his appointment if they needed anything from [place]. Patient expressed understanding. Nothing further needed at this time.

## 2020-09-10 ENCOUNTER — Ambulatory Visit: Payer: Self-pay | Admitting: Cardiology

## 2020-09-17 ENCOUNTER — Telehealth: Payer: Self-pay | Admitting: Pulmonary Disease

## 2020-09-17 NOTE — Telephone Encounter (Signed)
Called to speak with Morrie Sheldon, spoke with Cherylann Ratel, she provided a fax # to send the office notes, [phone].  Notes faxed, fax report states fax received.  Nothing further needed.

## 2020-09-18 ENCOUNTER — Ambulatory Visit: Payer: Self-pay | Admitting: Cardiology

## 2020-10-01 IMAGING — DX DG CHEST 1V PORT
1 series · 1 of 1 positions shown · non-contrast
Comparison: Portable exam 5032 hours compared to 07/12/2019

CLINICAL DATA: Dyspnea, hypertension, [id number]

EXAM:
PORTABLE CHEST 1 VIEW

[chest]
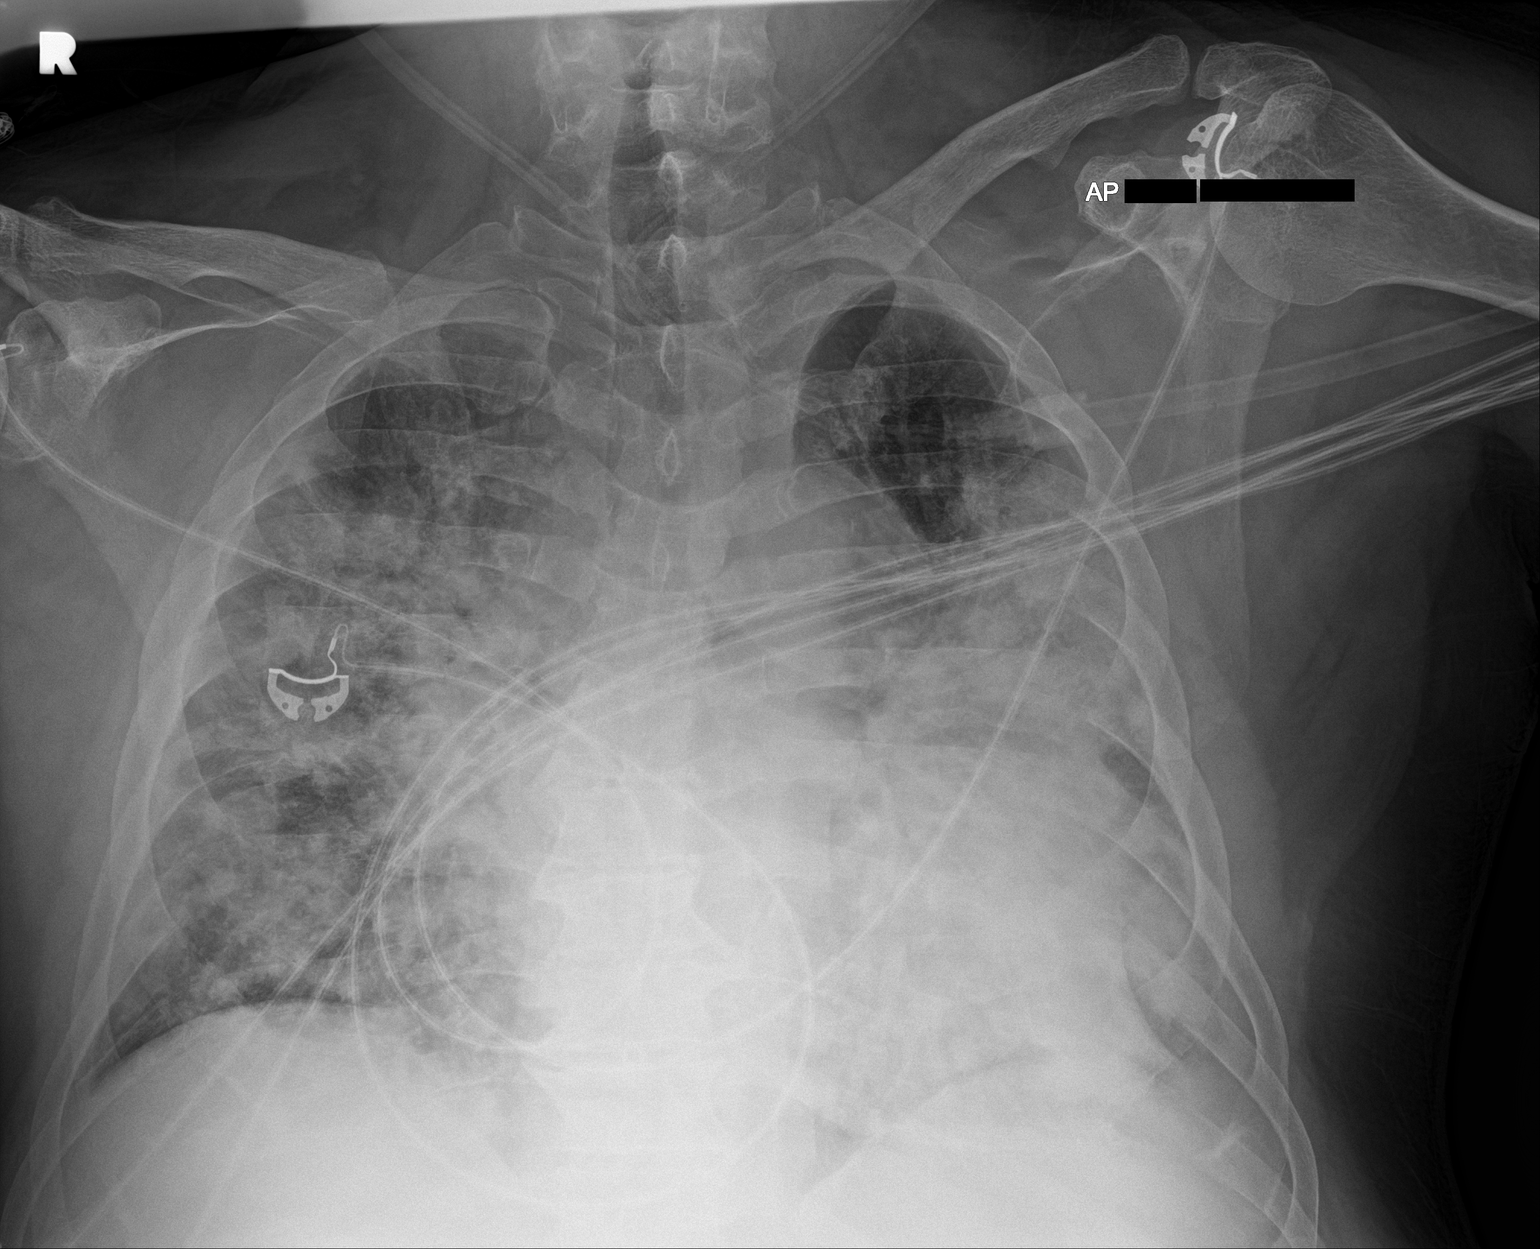

[1 of 1 positions shown; findings below may reference images not displayed]

FINDINGS: Enlargement of cardiac silhouette.

Diffuse BILATERAL airspace infiltrates consistent with multifocal
pneumonia, perhaps minimally improved on RIGHT since prior exam.

No pleural effusion or pneumothorax.

Osseous structures stable.
IMPRESSION: Diffuse BILATERAL pulmonary infiltrates consistent with multifocal
pneumonia, perhaps minimally improved on RIGHT.

## 2020-10-03 ENCOUNTER — Ambulatory Visit: Payer: Self-pay | Admitting: Cardiology

## 2020-10-19 ENCOUNTER — Ambulatory Visit: Payer: Self-pay | Admitting: Cardiology

## 2020-11-30 ENCOUNTER — Other Ambulatory Visit: Payer: Self-pay | Admitting: Cardiology

## 2020-11-30 ENCOUNTER — Ambulatory Visit: Payer: Self-pay | Admitting: Cardiology

## 2020-11-30 NOTE — Telephone Encounter (Signed)
Refill sent to pharmacy.   

## 2021-06-07 DIAGNOSIS — I350 Nonrheumatic aortic (valve) stenosis: Secondary | ICD-10-CM | POA: Diagnosis not present

## 2021-07-18 ENCOUNTER — Ambulatory Visit: Payer: BC Managed Care – PPO | Admitting: Neurology

## 2021-07-18 ENCOUNTER — Encounter: Payer: Self-pay | Admitting: Neurology

## 2021-07-18 ENCOUNTER — Other Ambulatory Visit: Payer: Self-pay

## 2021-07-18 VITALS — BP 152/87 | HR 77 | Ht 68.0 in | Wt 258.0 lb

## 2021-07-18 DIAGNOSIS — G250 Essential tremor: Secondary | ICD-10-CM

## 2021-07-18 DIAGNOSIS — G459 Transient cerebral ischemic attack, unspecified: Secondary | ICD-10-CM

## 2021-07-18 MED ORDER — PRIMIDONE 50 MG PO TABS
50.0000 mg | ORAL_TABLET | Freq: Three times a day (TID) | ORAL | 11 refills | Status: AC
Start: 2021-07-18 — End: 2021-08-17

## 2021-07-18 NOTE — Patient Instructions (Signed)
Start with Primidone 50 mg twice daily for 2 weeks  Then increase to 50 mg three time daily  Continue your other medications  Return in 6 months

## 2021-07-18 NOTE — Progress Notes (Signed)
[facility]  PATIENT: Frederick Gomez DOB: [birth year]  REQUESTING CLINICIAN: Angelina Sheriff, MD HISTORY FROM: Patient  REASON FOR VISIT: TIA/Tremors    HISTORICAL  CHIEF COMPLAINT:  Chief Complaint  Patient presents with   New Patient (Initial Visit)    Room 14 - alone. Referred for TIA event. Also, concerned about worsening tremors.    HISTORY OF PRESENT ILLNESS:  This is a 61 year old gentleman past medical history of diabetes mellitus type 2, TIA, obesity, obstructive sleep apnea on CPAP, essential tremor, hypertension, hyperlipidemia who is presenting after being admitted for TIA and worsening tremor.  Patient reports on December 29, he presented to [facility] emergency department due to sudden onset of difficulty speaking, he could not form words, could not read, and he had right-sided numbness involving the right leg, right arm and right side of his face.  He was admitted for stroke work-up.  His head CT was negative for any acute stroke and his CTA head and neck showed no large vessel occlusion or high-grade stenosis.  He was diagnosed with TIA and discharged home with Plavix.  Patient reports that since discharge he has been doing fine but noted that his tremor is getting worse.  He has a history of tremor for the past 3 years, describes shaking is worse when anxious and is sometimes painful to right. He has a family history of of tremor including his dad.  He was initially on primidone 250 mg daily but due to insurance issue has not been taking the medication for months.      OTHER MEDICAL CONDITIONS: Diabetes mellitus type 2, hypertension, hyperlipidemia, obesity, obstructive sleep apnea on CPAP, previous TIA.   REVIEW OF SYSTEMS: Full 14 system review of systems performed and negative with exception of: As noted in the HPI.  ALLERGIES: Allergies  Allergen Reactions   Hydrochlorothiazide Other (See Comments)    Unknown reaction type   Niaspan [Niacin]  Other (See Comments)    Unknown reaction type    Pravastatin Nausea Only   Simvastatin Other (See Comments)    Unknown reaction type    Latex Rash    HOME MEDICATIONS: Outpatient Medications Prior to Visit  Medication Sig Dispense Refill   albuterol (VENTOLIN HFA) 108 (90 Base) MCG/ACT inhaler Inhale 2 puffs into the lungs every 6 (six) hours as needed for wheezing or shortness of breath. 6.7 g 0   carvedilol (COREG) 25 MG tablet Take 1 tablet (25 mg total) by mouth 2 (two) times daily. 180 tablet 3   clopidogrel (PLAVIX) 75 MG tablet Take 75 mg by mouth daily.      furosemide (LASIX) 40 MG tablet Take 1 tablet by mouth twice daily 180 tablet 0   lovastatin (MEVACOR) 20 MG tablet Take 20 mg by mouth at bedtime.      metFORMIN (GLUCOPHAGE) 850 MG tablet Take 1 tablet (850 mg total) by mouth 2 (two) times daily.     polycarbophil (FIBERCON) 625 MG tablet Take 625 mg by mouth daily as needed (constipation.).      potassium chloride SA (KLOR-CON) 20 MEQ tablet Take 2 tablets (40 mEq total) by mouth daily. 180 tablet 1   Vitamin D, Ergocalciferol, (DRISDOL) 1.25 MG (50000 UNIT) CAPS capsule Take 1 capsule (50,000 Units total) by mouth every 7 (seven) days. 12 capsule 0   primidone (MYSOLINE) 250 MG tablet Take 250 mg by mouth daily as needed (tremor).      doxycycline (VIBRA-TABS) 100 MG tablet Take 100  mg by mouth 2 (two) times daily. (Patient not taking: Reported on 07/12/2020)     No facility-administered medications prior to visit.    PAST MEDICAL HISTORY: Past Medical History:  Diagnosis Date   Ascending aortic aneurysm 03/30/2019   COVID-19 virus infection 07/12/2019   Diabetes ([facility])    Essential hypertension 03/30/2019   Hyperlipidemia    Hypertension    Mild aortic regurgitation 03/30/2019   Mild aortic stenosis 03/30/2019   Mixed hyperlipidemia 03/30/2019   Obese    Obesity (BMI 30-39.9) 03/30/2019   OSA (obstructive sleep apnea) 04/01/2016   OSA on CPAP    Seasonal  allergies    TIA (transient ischemic attack)    Tremor    Type 2 diabetes mellitus without complication, without long-term current use of insulin ([place]) 03/30/2019    PAST SURGICAL HISTORY: Past Surgical History:  Procedure Laterality Date   CARDIAC CATHETERIZATION     HEMORROIDECTOMY     NOSE SURGERY     RIGHT/LEFT HEART CATH AND CORONARY ANGIOGRAPHY N/A 05/29/2020   Procedure: RIGHT/LEFT HEART CATH AND CORONARY ANGIOGRAPHY;  Surgeon: Jettie Booze, MD;  Location: [facility];  Service: Cardiovascular;  Laterality: N/A;    FAMILY HISTORY: Family History  Problem Relation Age of Onset   Diabetes Mother    Heart attack Mother    Suicidality Father     SOCIAL HISTORY: Social History   Socioeconomic History   Marital status: Single    Spouse name: Not on file   Number of children: 0   Years of education: HS   Highest education level: Not on file  Occupational History   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    Right-handed.   Caffeine cups: 5 per day.    Social Determinants of Health   Financial Resource Strain: Not on file  Food Insecurity: Not on file  Transportation Needs: Not on file  Physical Activity: Not on file  Stress: Not on file  Social Connections: Not on file  Intimate Partner Violence: Not on file     PHYSICAL EXAM  GENERAL EXAM/CONSTITUTIONAL: Vitals:  Vitals:   07/18/21 1350  BP: (!) 152/87  Pulse: 77  Weight: 258 lb (117 kg)  Height: 5\' 8"  (1.727 m)   Body mass index is 39.23 kg/m. Wt Readings from Last 3 Encounters:  07/18/21 258  lb (117 kg)  08/27/20 254 lb (115.2 kg)  07/12/20 248 lb 3.2 oz (112.6 kg)   Patient is in no distress; well developed, nourished and groomed; neck is supple  CARDIOVASCULAR: Examination of carotid arteries is normal; no carotid bruits Regular rate and rhythm, no murmurs Examination of peripheral vascular system by observation and palpation is normal  EYES: Pupils round and reactive to light, Visual fields full to confrontation, Extraocular movements intacts,   MUSCULOSKELETAL: Gait, strength, tone, movements noted in Neurologic exam below  NEUROLOGIC: MENTAL STATUS:  No flowsheet data found. awake, alert, oriented to person, place and time recent and remote memory intact normal attention  and concentration language fluent, comprehension intact, naming intact fund of knowledge appropriate  CRANIAL NERVE:  2nd, 3rd, 4th, 6th - pupils equal and reactive to light, visual fields full to confrontation, extraocular muscles intact, no nystagmus 5th - facial sensation symmetric 7th - facial strength symmetric 8th - hearing intact 9th - palate elevates symmetrically, uvula midline 11th - shoulder shrug symmetric 12th - tongue protrusion midline  MOTOR:  normal bulk and tone, full strength in the BUE, BLE  SENSORY:  normal and symmetric to light touch  COORDINATION:  finger-nose-finger, fine finger movements normal  REFLEXES:  deep tendon reflexes present and symmetric  GAIT/STATION:  normal     DIAGNOSTIC DATA (LABS, IMAGING, TESTING) - I reviewed patient records, labs, notes, testing and imaging myself where available.  Lab Results  Component Value Date   WBC 8.4 05/21/2020   HGB 15.6 05/29/2020   HCT 46.0 05/29/2020   MCV 85 05/21/2020   PLT 223 05/21/2020      Component Value Date/Time   NA 142 05/29/2020 0926   NA 142 05/21/2020 1123   K 4.2 05/29/2020 0926   CL 103 05/21/2020 1123   CO2 25 05/21/2020 1123   GLUCOSE 142 (H) 05/21/2020 1123   GLUCOSE  79 07/19/2019 0325   BUN 15 05/21/2020 1123   CREATININE 0.96 05/21/2020 1123   CALCIUM 9.2 05/21/2020 1123   PROT 6.5 07/16/2019 0035   ALBUMIN 2.9 (L) 07/16/2019 0035   AST 35 07/16/2019 0035   ALT 34 07/16/2019 0035   ALKPHOS 82 07/16/2019 0035   BILITOT 0.8 07/16/2019 0035   GFRNONAA 87 05/21/2020 1123   GFRAA 101 05/21/2020 1123   Lab Results  Component Value Date   CHOL 175 03/29/2019   HDL 37 (L) 03/29/2019   LDLCALC 118 (H) 03/29/2019   TRIG 108 03/29/2019   CHOLHDL 4.7 03/29/2019   Lab Results  Component Value Date   HGBA1C 8.6 (H) 07/12/2019   No results found for: VITAMINB12 No results found for: TSH  CT Head, CTA Head and neck [facility] 06/06/2021 No intracranial hemorrhage.  Aspect is done No emergent large vessel occlusion or high-grade stenosis of the head or neck.    ASSESSMENT AND PLAN  61 y.o. year old male with vascular risk factor including diabetes mellitus type 2, hypertension, hyperlipidemia, previous TIA, obstructive sleep apnea on CPAP who is presenting after being admitted to the hospital on 728 for right-sided numbness and speech difficulty.  Head CT was negative for any acute stroke and his CTA head and neck was negative for large vessel occlusion.  Patient diagnosed with TIA and discharged home with Plavix.  Since then he has been doing well, compliant with medication and denies any recrudescence of his symptoms. In terms of his tremor.  He has been going on for the past 3 years, he was previously on primidone up to 250 mg daily but due to insurance has not been compliant with his medication.  I will restart him on primidone initially 50 mg twice daily for 2 weeks and then will increase it to 50 mg 3 times daily.  Continue your other current medications and I will see him in 6 months for follow-up.   1. TIA (transient ischemic attack)   2. Essential tremor      Patient Instructions  Start with Primidone 50 mg twice daily for 2 weeks   Then increase to 50 mg three time daily  Continue your other medications  Return in 6 months  No orders of the defined types were placed in this encounter.   Meds ordered this encounter  Medications   primidone (MYSOLINE) 50 MG tablet    Sig: Take 1 tablet (50 mg total) by mouth 3 (three) times daily.    Dispense:  90 tablet    Refill:  11    Return in about 6 months (around 01/15/2022).  I have spent a total of more than 45 minutes dedicated to this patient today, preparing to see patient, examining the patient, ordering tests and/or medications, and counseling the patient including preparing to see the patient (review of tests); performing a medically appropriate examination and evaluation; ordering medication, test, and procedures; counseling and educating the patient/family/caregiver; independent independently interpreting result and communicating results to the family/patient/caregiver; and documenting clinical information in the electronic medical record.   Alric Ran, MD 07/19/2021, 12:43 PM  [facility] [address] [place], [place] [address] [phone]

## 2021-09-02 ENCOUNTER — Other Ambulatory Visit: Payer: Self-pay | Admitting: Cardiology

## 2021-12-23 ENCOUNTER — Other Ambulatory Visit: Payer: Self-pay | Admitting: Cardiology

## 2022-01-15 ENCOUNTER — Ambulatory Visit: Payer: BC Managed Care – PPO | Admitting: Neurology

## 2022-02-18 ENCOUNTER — Other Ambulatory Visit: Payer: Self-pay | Admitting: Cardiology

## 2022-04-06 ENCOUNTER — Other Ambulatory Visit: Payer: Self-pay | Admitting: Cardiology

## 2022-04-07 NOTE — Telephone Encounter (Signed)
Rx refill sent to pharmacy. 

## 2022-05-23 ENCOUNTER — Other Ambulatory Visit: Payer: Self-pay | Admitting: Cardiology

## 2022-07-21 ENCOUNTER — Other Ambulatory Visit: Payer: Self-pay | Admitting: Cardiology

## 2022-07-21 NOTE — Telephone Encounter (Signed)
Rx refill sent to pharmacy. 

## 2022-09-27 ENCOUNTER — Other Ambulatory Visit: Payer: Self-pay | Admitting: Cardiology

## 2022-12-09 ENCOUNTER — Other Ambulatory Visit: Payer: Self-pay | Admitting: *Deleted

## 2022-12-09 DIAGNOSIS — M79604 Pain in right leg: Secondary | ICD-10-CM

## 2023-01-02 ENCOUNTER — Encounter: Payer: BC Managed Care – PPO | Admitting: Vascular Surgery

## 2023-01-02 ENCOUNTER — Encounter (HOSPITAL_COMMUNITY): Payer: BC Managed Care – PPO

## 2023-02-12 ENCOUNTER — Encounter (HOSPITAL_COMMUNITY): Payer: BC Managed Care – PPO

## 2023-03-06 ENCOUNTER — Other Ambulatory Visit: Payer: Self-pay | Admitting: Vascular Surgery

## 2023-03-06 DIAGNOSIS — M79604 Pain in right leg: Secondary | ICD-10-CM

## 2023-09-04 ENCOUNTER — Other Ambulatory Visit: Payer: Self-pay

## 2023-09-04 DIAGNOSIS — I872 Venous insufficiency (chronic) (peripheral): Secondary | ICD-10-CM

## 2023-09-17 ENCOUNTER — Ambulatory Visit (HOSPITAL_COMMUNITY)
Admission: RE | Admit: 2023-09-17 | Discharge: 2023-09-17 | Disposition: A | Discharge status: Home / Self Care | Payer: Self-pay | Source: Ambulatory Visit | Attending: Vascular Surgery | Admitting: Vascular Surgery

## 2023-09-17 ENCOUNTER — Ambulatory Visit: Payer: Self-pay | Admitting: Physician Assistant

## 2023-09-17 VITALS — BP 178/99 | HR 89 | Temp 97.9°F | Resp 94 | Ht 68.0 in | Wt 233.9 lb

## 2023-09-17 DIAGNOSIS — I872 Venous insufficiency (chronic) (peripheral): Secondary | ICD-10-CM

## 2023-09-17 NOTE — Progress Notes (Signed)
 Requested by:  [name], PA-C [address] [place],  [place] [address]  Reason for consultation: bilateral leg swelling    History of Present Illness   Frederick Gomez is a 63 y.o. ([birth year]) male who presents for evaluation of bilateral lower extremity edema.  He states he has noticed bilateral lower leg swelling for several years, which got worse over the past 3 months.  He also notes worsening skin discoloration over the last 3 months.  He is up on his feet most of the day and notices that his leg swelling gets worse throughout the day.  This causes his legs to feel heavy and tight.  He has tried to wear knee-high compression stockings which do help his symptoms, however he does not know if they are the right size.  He does not elevate his legs.  He has no prior history of DVT or venous ulcerations.  Past Medical History:  Diagnosis Date   Ascending aortic aneurysm ([facility]) 03/30/2019   COVID-19 virus infection 07/12/2019   Diabetes ([facility])    Essential hypertension 03/30/2019   Hyperlipidemia    Hypertension    Mild aortic regurgitation 03/30/2019   Mild aortic stenosis 03/30/2019   Mixed hyperlipidemia 03/30/2019   Obese    Obesity (BMI 30-39.9) 03/30/2019   OSA (obstructive sleep apnea) 04/01/2016   OSA on CPAP    Seasonal allergies    TIA (transient ischemic attack)    Tremor    Type 2 diabetes mellitus without complication, without long-term current use of insulin ([facility]) 03/30/2019    Past Surgical History:  Procedure Laterality Date   CARDIAC CATHETERIZATION     HEMORROIDECTOMY     NOSE SURGERY     RIGHT/LEFT HEART CATH AND CORONARY ANGIOGRAPHY N/A 05/29/2020   Procedure: RIGHT/LEFT HEART CATH AND CORONARY ANGIOGRAPHY;  Surgeon: Lucendia Rusk, MD;  Location: [facility];  Service: Cardiovascular;  Laterality: N/A;    Social History   Socioeconomic History   Marital status: Single    Spouse name: Not on file   Number of children: 0   Years  of education: HS   Highest education level: Not on file  Occupational History   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    Right-handed.   Caffeine cups: 5 per day.    Social Drivers of Corporate investment banker Strain: Not on file  Food Insecurity: Not on file  Transportation Needs: Not on file  Physical Activity: Not on file  Stress: Not on file  Social Connections: Not on file  Intimate Partner Violence: Not on file    Family History  Problem Relation Age of Onset   Diabetes Mother    Heart attack Mother    Suicidality Father     Current Outpatient Medications  Medication Sig Dispense Refill   albuterol (VENTOLIN HFA) 108 (90 Base) MCG/ACT inhaler Inhale 2 puffs into the lungs every 6 (six) hours as needed for  wheezing or shortness of breath. 6.7 g 0   carvedilol (COREG) 25 MG tablet Take 1 tablet (25 mg total) by mouth 2 (two) times daily. 180 tablet 3   clopidogrel (PLAVIX) 75 MG tablet Take 75 mg by mouth daily.      furosemide (LASIX) 40 MG tablet TAKE 1 TABLET BY MOUTH TWICE DAILY . APPOINTMENT REQUIRED FOR FUTURE REFILLS 30 tablet 0   lovastatin (MEVACOR) 20 MG tablet Take 20 mg by mouth at bedtime.      metFORMIN (GLUCOPHAGE) 850 MG tablet Take 1 tablet (850 mg total) by mouth 2 (two) times daily.     polycarbophil (FIBERCON) 625 MG tablet Take 625 mg by mouth daily as needed (constipation.).      potassium chloride SA (KLOR-CON) 20 MEQ tablet Take 2 tablets (40 mEq total) by mouth daily. 180 tablet 1   Vitamin D, Ergocalciferol,  (DRISDOL) 1.25 MG (50000 UNIT) CAPS capsule Take 1 capsule (50,000 Units total) by mouth every 7 (seven) days. 12 capsule 0   primidone (MYSOLINE) 50 MG tablet Take 1 tablet (50 mg total) by mouth 3 (three) times daily. 90 tablet 11   No current facility-administered medications for this visit.    Allergies  Allergen Reactions   Hydrochlorothiazide Other (See Comments)    Unknown reaction type   Niaspan [Niacin] Other (See Comments)    Unknown reaction type    Pravastatin Nausea Only   Simvastatin Other (See Comments)    Unknown reaction type    Latex Rash    REVIEW OF SYSTEMS (negative unless checked):   Cardiac:  []  Chest pain or chest pressure? []  Shortness of breath upon activity? []  Shortness of breath when lying flat? []  Irregular heart rhythm?  Vascular:  []  Pain in calf, thigh, or hip brought on by walking? []  Pain in feet at night that wakes you up from your sleep? []  Blood clot in your veins? [x]  Leg swelling?  Pulmonary:  []  Oxygen at home? []  Productive cough? []  Wheezing?  Neurologic:  []  Sudden weakness in arms or legs? []  Sudden numbness in arms or legs? []  Sudden onset of difficult speaking or slurred speech? []  Temporary loss of vision in one eye? []  Problems with dizziness?  Gastrointestinal:  []  Blood in stool? []  Vomited blood?  Genitourinary:  []  Burning when urinating? []  Blood in urine?  Psychiatric:  []  Major depression  Hematologic:  []  Bleeding problems? []  Problems with blood clotting?  Dermatologic:  []  Rashes or ulcers?  Constitutional:  []  Fever or chills?  Ear/Nose/Throat:  []  Change in hearing? []  Nose bleeds? []  Sore throat?  Musculoskeletal:  []  Back pain? []  Joint pain? []  Muscle pain?   Physical Examination     Vitals:   09/17/23 1057  BP: (!) 178/99  Pulse: 89  Resp: (!) 94  Temp: 97.9 F (36.6 C)  TempSrc: Temporal  Weight: 233 lb 14.4 oz (106.1 kg)  Height: 5\' 8"  (1.727 m)   Body mass  index is 35.56 kg/m.  General:  WDWN in NAD; vital signs documented above Gait: Not observed HENT: WNL, normocephalic Pulmonary: normal non-labored breathing , without Rales, rhonchi,  wheezing Cardiac: regular Abdomen: soft, NT, no masses Skin: without rashes Vascular Exam/Pulses: 2+ PT pulses bilaterally Extremities: BLE with 1+ edema, stasis pigmentation circumferentially around distal lower legs  Musculoskeletal: no muscle wasting or atrophy  Neurologic: A&O X 3;  No focal weakness or paresthesias are detected Psychiatric:  The pt has Normal affect.  Non-invasive Vascular  Imaging   RLE Venous Insufficiency Duplex (09/17/2023):  +--------------+---------+-------+-----------+------------+--------+  RIGHT        Reflux NoReflux Reflux TimeDiameter cmsComments  +--------------+---------+-------+-----------+------------+--------+  CFV          no                                               +--------------+---------+-------+-----------+------------+--------+  FV mid        no                                               +--------------+---------+-------+-----------+------------+--------+  Popliteal    no                                               +--------------+---------+-------+-----------+------------+--------+  GSV at [facility]    no                             0.65              +--------------+---------+-------+-----------+------------+--------+  GSV prox thigh           yes    >500 ms      0.67              +--------------+---------+-------+-----------+------------+--------+  GSV mid thigh no                             0.31              +--------------+---------+-------+-----------+------------+--------+  GSV dist thighno                             0.26              +--------------+---------+-------+-----------+------------+--------+  GSV at knee              yes    >500 ms      0.27               +--------------+---------+-------+-----------+------------+--------+  GSV prox calf no                             0.35              +--------------+---------+-------+-----------+------------+--------+  GSV mid calf  no                             0.25              +--------------+---------+-------+-----------+------------+--------+  GSV dist calf no                             0.26              +--------------+---------+-------+-----------+------------+--------+  SSV Pop Fossa no  0.34              +--------------+---------+-------+-----------+------------+--------+  SSV prox calf no                             0.26              +--------------+---------+-------+-----------+------------+--------+  SSV mid calf             yes    >500 ms      0.27              +--------------+---------+-------+-----------+------------+--------+     Medical Decision Making   Larren Copes is a 63 y.o. male who presents for evaluation of venous insufficiency  Based on the patient's duplex, there is reflux in the patient's right greater saphenous vein at the proximal thigh and knee.  There is also reflux in the small saphenous vein at the mid calf.  The remainder of the patient's deep and superficial venous system is competent.  There is no evidence of DVT or SVT on exam.  He would not be a candidate for saphenous vein ablation given that his GSV is competent throughout most of the thigh He describes several years of bilateral lower extremity swelling that has worsened over the past 3 months.  His leg swelling gets worse throughout the day after spending a long time on his feet.  This causes his legs to feel heavy and tight.  He wears knee-high compression stockings, which does help with his symptoms.  He does not elevate his legs On exam he has 1+ bilateral lower leg swelling with stasis pigmentation circumferentially around his distal lower  legs and ankles.  He has 2+ PT pulses I have explained to the patient that he would not be a candidate for ablation therapy for his venous insufficiency.  I believe that his symptoms would be well-controlled with well-fitting compression stockings, exercise, weight loss, and proper leg elevation.  He was measured for and given a pair of 15 to 20 mmHg knee-high compression stockings.  I have encouraged him to make sure his legs are above his heart when he elevates. He can follow-up with our office as needed  Ron Cobbs, PA-C [facility] Office: [phone]  09/17/2023, 11:01 AM  Clinic MD: Rosalva Comber

## 2023-12-30 DIAGNOSIS — I352 Nonrheumatic aortic (valve) stenosis with insufficiency: Secondary | ICD-10-CM | POA: Diagnosis not present

## 2023-12-31 DIAGNOSIS — I63331 Cerebral infarction due to thrombosis of right posterior cerebral artery: Secondary | ICD-10-CM | POA: Diagnosis not present

## 2024-01-19 ENCOUNTER — Encounter: Payer: Self-pay | Admitting: Internal Medicine

## 2024-02-04 ENCOUNTER — Telehealth: Payer: Self-pay | Admitting: Neurology

## 2024-02-04 NOTE — Telephone Encounter (Signed)
 Appointment details confirmed

## 2024-02-09 ENCOUNTER — Encounter: Payer: Self-pay | Admitting: Neurology

## 2024-02-09 ENCOUNTER — Encounter: Payer: Self-pay | Admitting: *Deleted

## 2024-02-09 ENCOUNTER — Ambulatory Visit: Admitting: Neurology

## 2024-02-09 ENCOUNTER — Other Ambulatory Visit: Payer: Self-pay | Admitting: Neurology

## 2024-02-09 VITALS — BP 170/82 | HR 66 | Ht 68.0 in | Wt 230.0 lb

## 2024-02-09 DIAGNOSIS — I6930 Unspecified sequelae of cerebral infarction: Secondary | ICD-10-CM

## 2024-02-09 DIAGNOSIS — G250 Essential tremor: Secondary | ICD-10-CM | POA: Diagnosis not present

## 2024-02-09 DIAGNOSIS — I63531 Cerebral infarction due to unspecified occlusion or stenosis of right posterior cerebral artery: Secondary | ICD-10-CM | POA: Diagnosis not present

## 2024-02-09 DIAGNOSIS — I639 Cerebral infarction, unspecified: Secondary | ICD-10-CM

## 2024-02-09 NOTE — Progress Notes (Signed)
 [facility]  PATIENT: Frederick Gomez DOB: 1960/12/10  REQUESTING CLINICIAN: Ina Marcellus RAMAN, MD HISTORY FROM: Patient/Chart review  REASON FOR VISIT: Right PCA stroke    HISTORICAL  CHIEF COMPLAINT:  Chief Complaint  Patient presents with   Hospitalization Follow-up    RM 13- Stroke    HISTORY OF PRESENT ILLNESS:  This is a 63 year old gentleman with vascular risk factors including hypertension, hyperlipidemia, diabetes mellitus, heart disease who is presenting after a stroke in July.  Patient reports the day of the stroke, he woke up was not feeling well, was driving was but was not right in his right state of health.  He went home, took a nap, and the next day went to see his PCP, who recommended going to the hospital.  In the ED, he was found to have a subacute right PCA territory stroke causing left visual field.  He was recommended aspirin  and Plavix  and discharged home with occupational therapy.  Patient tells me that he is still on aspirin  and Plavix , he was recommended switching lovastatin to rosuvastatin but has not done so until lately when his PCP switched him to rosuvastatin.  He tells me he is continue to get occupational therapy once a week, has seen ophthalmology, was told that his visual acuity is normal.  His main deficit currently is his visual field cut, has not been driving lately, he does have very mild left-sided weakness.  He does use a cane with ambulation and denies any recent falls.    OTHER MEDICAL CONDITIONS: Hypertension, Hyperlipidemia, Obesity, Diabetes    REVIEW OF SYSTEMS: Full 14 system review of systems performed and negative with exception of: As noted in the HPI   ALLERGIES: Allergies  Allergen Reactions   Hydrochlorothiazide Other (See Comments)    Unknown reaction type   Niaspan [Niacin] Other (See Comments)    Unknown reaction type    Pravastatin  Nausea Only   Simvastatin Other (See Comments)    Unknown reaction type     Latex Rash    HOME MEDICATIONS: Outpatient Medications Prior to Visit  Medication Sig Dispense Refill   albuterol  (VENTOLIN  HFA) 108 (90 Base) MCG/ACT inhaler Inhale 2 puffs into the lungs every 6 (six) hours as needed for wheezing or shortness of breath. 6.7 g 0   carvedilol  (COREG ) 25 MG tablet Take 1 tablet (25 mg total) by mouth 2 (two) times daily. 180 tablet 3   clopidogrel  (PLAVIX ) 75 MG tablet Take 75 mg by mouth daily.      furosemide  (LASIX ) 40 MG tablet TAKE 1 TABLET BY MOUTH TWICE DAILY . APPOINTMENT REQUIRED FOR FUTURE REFILLS 30 tablet 0   lovastatin (MEVACOR) 20 MG tablet Take 20 mg by mouth at bedtime.      metFORMIN  (GLUCOPHAGE ) 850 MG tablet Take 1 tablet (850 mg total) by mouth 2 (two) times daily.     polycarbophil (FIBERCON) 625 MG tablet Take 625 mg by mouth daily as needed (constipation.).      potassium chloride  SA (KLOR-CON ) 20 MEQ tablet Take 2 tablets (40 mEq total) by mouth daily. 180 tablet 1   Vitamin D , Ergocalciferol , (DRISDOL ) 1.25 MG (50000 UNIT) CAPS capsule Take 1 capsule (50,000 Units total) by mouth every 7 (seven) days. 12 capsule 0   primidone  (MYSOLINE ) 50 MG tablet Take 1 tablet (50 mg total) by mouth 3 (three) times daily. (Patient not taking: Reported on 02/09/2024) 90 tablet 11   No facility-administered medications prior to visit.    PAST  MEDICAL HISTORY: Past Medical History:  Diagnosis Date   Ascending aortic aneurysm (HCC) 03/30/2019   COVID-19 virus infection 07/12/2019   Diabetes (HCC)    Essential hypertension 03/30/2019   Hyperlipidemia    Hypertension    Mild aortic regurgitation 03/30/2019   Mild aortic stenosis 03/30/2019   Mixed hyperlipidemia 03/30/2019   Obese    Obesity (BMI 30-39.9) 03/30/2019   OSA (obstructive sleep apnea) 04/01/2016   OSA on CPAP    Seasonal allergies    TIA (transient ischemic attack)    Tremor    Type 2 diabetes mellitus without complication, without long-term current use of insulin  (HCC)  03/30/2019    PAST SURGICAL HISTORY: Past Surgical History:  Procedure Laterality Date   CARDIAC CATHETERIZATION     HEMORROIDECTOMY     NOSE SURGERY     RIGHT/LEFT HEART CATH AND CORONARY ANGIOGRAPHY N/A 05/29/2020   Procedure: RIGHT/LEFT HEART CATH AND CORONARY ANGIOGRAPHY;  Surgeon: Dann Candyce RAMAN, MD;  Location: [facility];  Service: Cardiovascular;  Laterality: N/A;    FAMILY HISTORY: Family History  Problem Relation Age of Onset   Diabetes Mother    Heart attack Mother    Suicidality Father     SOCIAL HISTORY: Social History   Socioeconomic History   Marital status: Single    Spouse name: Not on file   Number of children: 0   Years of education: HS   Highest education level: Not on file  Occupational History   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    Right-handed.   Caffeine cups: 5 per day.    Social Drivers of Corporate investment banker Strain: Not on file  Food Insecurity: Not on file  Transportation Needs: Not on file  Physical Activity: Not on file  Stress: Not on file  Social Connections: Not on file  Intimate Partner Violence: Not on file    PHYSICAL EXAM GENERAL EXAM/CONSTITUTIONAL: Vitals:  Vitals:   02/09/24 0902 02/09/24 0906  BP: (!) 167/68 (!) 170/82  Pulse: 66   SpO2: 96%   Weight: 230 lb (104.3 kg)   Height: 5' 8 (1.727 m)    Body mass index is 34.97 kg/m. Wt Readings from Last 3 Encounters:  02/09/24 230 lb (104.3 kg)  09/17/23 233 lb 14.4 oz (106.1 kg)  07/18/21 258 lb (117 kg)    Patient is in no distress; well developed, nourished and groomed; neck is supple  MUSCULOSKELETAL: Gait, strength, tone, movements noted in Neurologic exam below  NEUROLOGIC: MENTAL STATUS:      No data to display         awake, alert, oriented to person, place and time recent and remote memory intact normal attention and concentration language fluent, comprehension intact, naming intact fund of knowledge appropriate  CRANIAL NERVE:  2nd, 3rd, 4th, 6th - Left visual field cut, extraocular muscles intact, no nystagmus 5th - facial sensation symmetric 7th - facial strength symmetric 8th - hearing  intact 9th - palate elevates symmetrically, uvula midline 11th - shoulder shrug symmetric 12th - tongue protrusion midline  MOTOR:  normal bulk and tone, full strength in the BUE, BLE  SENSORY:  normal and symmetric to light touch  COORDINATION:  finger-nose-finger, fine finger movements normal  GAIT/STATION:  Ambulates with a cane   DIAGNOSTIC DATA (LABS, IMAGING, TESTING) - I reviewed patient records, labs, notes, testing and imaging myself where available.  Lab Results  Component Value Date   WBC 8.4 05/21/2020   HGB 15.6 05/29/2020   HCT 46.0 05/29/2020   MCV 85 05/21/2020   PLT 223 05/21/2020      Component Value Date/Time   NA 142 05/29/2020 0926   NA 142 05/21/2020 1123   K 4.2 05/29/2020 0926   CL 103 05/21/2020 1123   CO2 25 05/21/2020 1123   GLUCOSE 142 (H) 05/21/2020 1123   GLUCOSE 79 07/19/2019 0325   BUN 15 05/21/2020 1123   CREATININE 0.96 05/21/2020 1123   CALCIUM 9.2 05/21/2020 1123   PROT 6.5 07/16/2019 0035   ALBUMIN 2.9 (L) 07/16/2019 0035   AST 35 07/16/2019 0035   ALT 34 07/16/2019 0035   ALKPHOS 82 07/16/2019 0035   BILITOT 0.8 07/16/2019 0035   GFRNONAA 87 05/21/2020 1123   GFRAA 101 05/21/2020 1123   Lab Results  Component Value Date   CHOL 175 03/29/2019   HDL 37 (L) 03/29/2019   LDLCALC 118 (H) 03/29/2019   TRIG 108  03/29/2019   CHOLHDL 4.7 03/29/2019   Lab Results  Component Value Date   HGBA1C 8.6 (H) 07/12/2019   No results found for: VITAMINB12 No results found for: TSH   Head CT 12/30/2023 Subacute right PCA ischemic stroke.  MRI Brain 12/30/2023 Acute infarction in the medial right occipital lobe corresponds to area of concern on CTA head Punctate acute infarction in the right thalamus Intracranial small vessel disease more than anticipated for patient's age.  CTA Head and Neck 12/30/2023 There is a high-grade stenosis versus short segment occlusion and reconstitution of the right posterior cerebral P2-3 segment, with diminished flow in the right occipital branches.    ASSESSMENT AND PLAN  63 y.o. year old male with vascular risk factors including hypertension, hyperlipidemia, diabetes mellitus, heart disease who is presenting after being diagnosed with a right PCA territory stroke.  Stroke etiology likely large vessel disease due to finding on CTA but cannot rule out cardioembolic.  He is already on aspirin  and Plavix , advised patient to continue with Aspirin  and Plavix  for total of 3 months and then to continue with Plavix  alone.  We will obtain a 30-day cardiac monitor. When it comes to his essential tremor, he was not able to tolerate primidone , he is already on carvedilol , advised patient to discuss with his doctor regarding switching carvedilol  to propranolol but if not feasible, we will consider topiramate.  I will see him in 6 to 8 months for follow-up or sooner if worse.   1. 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 Essential tremor      Patient Instructions  Continue with Aspirin  and Plavix  for a total of 3 months then continue with Plavix  thereafter  Will obtain a 30 days cardiac monitor  Continue your other medications  Continue with occupation therapy  Consider driving test or driving stimulation prior to driving  again  Please discuss with your doctor regarding switching Carvedilol  to Propranolol to help with the essential tremors (  was not able tolerate Primidone ) Another consideration is Topiramate for the essential tremors  Return in 6 to 8 months    Orders Placed This Encounter  Procedures   CARDIAC EVENT MONITOR    No orders of the defined types were placed in this encounter.   Return in about 6 months (around 08/08/2024).  I have spent a total of 60 minutes dedicated to this patient today, preparing to see patient, performing a medically appropriate examination and evaluation, ordering tests and/or medications and procedures, and counseling and educating the patient/family/caregiver; independently interpreting result and communicating results to the family/patient/caregiver; and documenting clinical information in the electronic medical record.   Pastor Falling, MD 02/09/2024, 11:22 AM  [facility] [address], [address] [place], [place] [address] [phone]

## 2024-02-09 NOTE — Patient Instructions (Addendum)
 Continue with Aspirin  and Plavix  for a total of 3 months then continue with Plavix  thereafter  Will obtain a 30 days cardiac monitor  Continue your other medications  Continue with occupation therapy  Consider driving test or driving stimulation prior to driving again  Please discuss with your doctor regarding switching Carvedilol  to Propranolol to help with the essential tremors (was not able tolerate Primidone ) Another consideration is Topiramate for the essential tremors  Return in 6 to 8 months

## 2024-02-09 NOTE — Progress Notes (Unsigned)
Patient enrolled for [employer] [facility] [employer] to ship a 30 day cardiac event monitor to his address on file.  Letter with instructions mailed to patient.  Dr. Harriet Masson to read.

## 2024-02-26 ENCOUNTER — Ambulatory Visit: Attending: Neurology

## 2024-02-26 DIAGNOSIS — I639 Cerebral infarction, unspecified: Secondary | ICD-10-CM

## 2024-02-26 DIAGNOSIS — I63531 Cerebral infarction due to unspecified occlusion or stenosis of right posterior cerebral artery: Secondary | ICD-10-CM

## 2024-02-26 DIAGNOSIS — I6930 Unspecified sequelae of cerebral infarction: Secondary | ICD-10-CM

## 2024-02-28 DIAGNOSIS — I6930 Unspecified sequelae of cerebral infarction: Secondary | ICD-10-CM

## 2024-02-28 DIAGNOSIS — I63531 Cerebral infarction due to unspecified occlusion or stenosis of right posterior cerebral artery: Secondary | ICD-10-CM | POA: Diagnosis not present

## 2024-02-28 DIAGNOSIS — I639 Cerebral infarction, unspecified: Secondary | ICD-10-CM | POA: Diagnosis not present

## 2024-02-29 ENCOUNTER — Ambulatory Visit: Payer: Self-pay | Admitting: Neurology

## 2024-02-29 NOTE — Progress Notes (Signed)
 Please call and advise the patient that the cardiac monitor did no show evidence of atrial fibrillation. No further action is required on these tests at this time. Please remind patient to keep any upcoming appointments or tests and to call us  with any interim questions, concerns, problems or updates. Thanks,   Pastor Falling, MD

## 2024-02-29 NOTE — Telephone Encounter (Signed)
-----   Message from [facility] sent at 02/29/2024  8:26 AM EDT ----- Please call and advise the patient that the cardiac monitor did no show evidence of atrial fibrillation. No further action is required on these tests at this time. Please remind patient to keep any  upcoming appointments or tests and to call us  with any interim questions, concerns, problems or updates. Thanks,   Pastor Falling, MD   ----- Message ----- From: Shlomo Wilbert SAUNDERS, MD Sent: 02/28/2024   4:14 PM EDT To: Pastor Falling, MD

## 2024-02-29 NOTE — Telephone Encounter (Signed)
 Pt is asking for a call back from CMA

## 2024-02-29 NOTE — Telephone Encounter (Signed)
 Lvm 1st attempt by hf 02/29/24

## 2024-09-06 ENCOUNTER — Ambulatory Visit: Admitting: Adult Health
